# Patient Record
Sex: Female | Born: 1970 | Race: Black or African American | Hispanic: No | Smoking: Never smoker
Health system: Southern US, Community
[De-identification: ages and names within clinical notes are randomized; demographics above are authoritative.]

## PROBLEM LIST (undated history)

## (undated) DIAGNOSIS — R002 Palpitations: Secondary | ICD-10-CM

## (undated) DIAGNOSIS — R079 Chest pain, unspecified: Secondary | ICD-10-CM

## (undated) DIAGNOSIS — I10 Essential (primary) hypertension: Secondary | ICD-10-CM

## (undated) HISTORY — DX: Palpitations: R00.2

## (undated) HISTORY — DX: Chest pain, unspecified: R07.9

## (undated) HISTORY — PX: TUBAL LIGATION: SHX77

## (undated) HISTORY — PX: CHOLECYSTECTOMY: SHX55

---

## 1997-10-02 ENCOUNTER — Other Ambulatory Visit: Admission: RE | Admit: 1997-10-02 | Discharge: 1997-10-02 | Payer: Self-pay | Admitting: Obstetrics & Gynecology

## 1997-10-22 ENCOUNTER — Inpatient Hospital Stay (HOSPITAL_COMMUNITY): Admission: AD | Admit: 1997-10-22 | Discharge: 1997-10-22 | Payer: Self-pay | Admitting: Obstetrics and Gynecology

## 1997-10-26 ENCOUNTER — Inpatient Hospital Stay (HOSPITAL_COMMUNITY): Admission: AD | Admit: 1997-10-26 | Discharge: 1997-10-28 | Payer: Self-pay | Admitting: Obstetrics & Gynecology

## 2001-08-10 ENCOUNTER — Ambulatory Visit (HOSPITAL_COMMUNITY): Admission: RE | Admit: 2001-08-10 | Discharge: 2001-08-10 | Payer: Self-pay | Admitting: Family Medicine

## 2001-08-10 ENCOUNTER — Encounter: Payer: Self-pay | Admitting: Family Medicine

## 2001-09-03 ENCOUNTER — Encounter: Payer: Self-pay | Admitting: Emergency Medicine

## 2001-09-03 ENCOUNTER — Emergency Department (HOSPITAL_COMMUNITY): Admission: EM | Admit: 2001-09-03 | Discharge: 2001-09-03 | Payer: Self-pay | Admitting: Emergency Medicine

## 2001-09-03 ENCOUNTER — Encounter: Payer: Self-pay | Admitting: *Deleted

## 2003-07-11 ENCOUNTER — Other Ambulatory Visit: Admission: RE | Admit: 2003-07-11 | Discharge: 2003-07-11 | Payer: Self-pay | Admitting: Family Medicine

## 2004-08-27 ENCOUNTER — Emergency Department (HOSPITAL_COMMUNITY): Admission: EM | Admit: 2004-08-27 | Discharge: 2004-08-28 | Payer: Self-pay | Admitting: Emergency Medicine

## 2007-05-07 ENCOUNTER — Emergency Department (HOSPITAL_COMMUNITY): Admission: EM | Admit: 2007-05-07 | Discharge: 2007-05-07 | Payer: Self-pay | Admitting: Emergency Medicine

## 2011-01-13 ENCOUNTER — Encounter: Payer: Self-pay | Admitting: *Deleted

## 2011-01-13 ENCOUNTER — Emergency Department (HOSPITAL_BASED_OUTPATIENT_CLINIC_OR_DEPARTMENT_OTHER)
Admission: EM | Admit: 2011-01-13 | Discharge: 2011-01-13 | Disposition: A | Discharge status: Home / Self Care | Payer: BC Managed Care – PPO | Attending: Emergency Medicine | Admitting: Emergency Medicine

## 2011-01-13 DIAGNOSIS — R209 Unspecified disturbances of skin sensation: Secondary | ICD-10-CM | POA: Insufficient documentation

## 2011-01-13 DIAGNOSIS — IMO0002 Reserved for concepts with insufficient information to code with codable children: Secondary | ICD-10-CM | POA: Insufficient documentation

## 2011-01-13 DIAGNOSIS — X58XXXA Exposure to other specified factors, initial encounter: Secondary | ICD-10-CM | POA: Insufficient documentation

## 2011-01-13 DIAGNOSIS — M779 Enthesopathy, unspecified: Secondary | ICD-10-CM

## 2011-01-13 DIAGNOSIS — T148XXA Other injury of unspecified body region, initial encounter: Secondary | ICD-10-CM

## 2011-01-13 DIAGNOSIS — Y9229 Other specified public building as the place of occurrence of the external cause: Secondary | ICD-10-CM | POA: Insufficient documentation

## 2011-01-13 HISTORY — DX: Essential (primary) hypertension: I10

## 2011-01-13 MED ORDER — IBUPROFEN 800 MG PO TABS: 800.0000 mg | ORAL_TABLET | Freq: Three times a day (TID) | ORAL | Status: AC | PRN

## 2011-01-13 MED ORDER — IBUPROFEN 800 MG PO TABS
800.0000 mg | ORAL_TABLET | Freq: Once | ORAL | Status: AC
  Administered 2011-01-13: 800 mg via ORAL
  Filled 2011-01-13: qty 1

## 2011-01-13 MED ORDER — DIAZEPAM 5 MG PO TABS
5.0000 mg | ORAL_TABLET | Freq: Once | ORAL | Status: AC
  Administered 2011-01-13: 5 mg via ORAL
  Filled 2011-01-13: qty 1

## 2011-01-13 MED ORDER — DIAZEPAM 5 MG PO TABS: 5.0000 mg | ORAL_TABLET | Freq: Four times a day (QID) | ORAL | Status: AC | PRN

## 2011-01-13 NOTE — ED Notes (Signed)
PT c/o left upper leg pain that began approx 2days ago while walking at work. No obvious or known injury.

## 2011-01-13 NOTE — ED Provider Notes (Signed)
History    The patient reports 2 days of left leg pain that on closer examination is localized to the left hip flexor tendon origin and the left adductor tendon origin. She reports first noticing the pain while she was at work 2 days ago, she works at [employer] as a Production designer, theatre/television/film. She denies any new exercise routine for recent exertional strain that may have strained the muscles. The pain is worse with active hip flexion on the left or abduction of the hip on the left. The pain is described as a dull ache and is nonradiating and well localized. She denies any distal numbness or weakness of the left lower extremity. She denies any localized rash and there is no evidence of lymphadenopathy.  No chief complaint on file.  Patient is a 40 y.o. female presenting with hip pain. The history is provided by the patient.  Hip Pain This is a new problem. The current episode started 2 days ago. The problem occurs constantly. The problem has not changed since onset.Pertinent negatives include no chest pain and no shortness of breath. The symptoms are aggravated by walking, bending and standing. The symptoms are relieved by nothing. She has tried nothing for the symptoms.    No past medical history on file.  No past surgical history on file.  No family history on file.  History  Substance Use Topics  . Smoking status: Not on file  . Smokeless tobacco: Not on file  . Alcohol Use: Not on file    OB History    No data available      Review of Systems  Respiratory: Negative for shortness of breath.   Cardiovascular: Negative for chest pain.  Musculoskeletal: Negative for back pain, joint swelling and gait problem.  Skin: Negative.   Neurological: Negative for weakness and numbness.    Physical Exam  There were no vitals taken for this visit.  Physical Exam  Constitutional: She is oriented to person, place, and time. She appears well-developed. No distress.  HENT:  Head: Normocephalic and atraumatic.    Eyes: EOM are normal. Pupils are equal, round, and reactive to light.  Neck: Neck supple. No JVD present.  Cardiovascular: Normal rate, regular rhythm, normal heart sounds and intact distal pulses.  Exam reveals no gallop and no friction rub.   No murmur heard. Pulmonary/Chest: Effort normal and breath sounds normal. No respiratory distress. She has no wheezes. She has no rales.  Abdominal: Soft. There is no tenderness. There is no guarding.  Musculoskeletal: Normal range of motion. She exhibits tenderness. She exhibits no edema.       Left hip: She exhibits tenderness.       Legs: Neurological: She is alert and oriented to person, place, and time. She has normal reflexes.  Skin: Skin is warm and dry. No rash noted.  Psychiatric: She has a normal mood and affect.    ED Course  Procedures  MDM Muscle strain and possibly mild tendonitis.  No localized infectious or inflammatory process suggested otherwise.  The patient's presentation is not suggestive of thromboembolism or fracture.      Felisa Bonier, MD 01/13/11 2016

## 2011-03-03 ENCOUNTER — Other Ambulatory Visit: Payer: Self-pay | Admitting: Obstetrics and Gynecology

## 2011-03-03 DIAGNOSIS — R928 Other abnormal and inconclusive findings on diagnostic imaging of breast: Secondary | ICD-10-CM

## 2011-03-31 ENCOUNTER — Ambulatory Visit
Admission: RE | Admit: 2011-03-31 | Discharge: 2011-03-31 | Disposition: A | Discharge status: Home / Self Care | Payer: BC Managed Care – PPO | Source: Ambulatory Visit | Attending: Obstetrics and Gynecology | Admitting: Obstetrics and Gynecology

## 2011-03-31 DIAGNOSIS — R928 Other abnormal and inconclusive findings on diagnostic imaging of breast: Secondary | ICD-10-CM

## 2011-08-31 ENCOUNTER — Emergency Department (HOSPITAL_COMMUNITY)
Admission: EM | Admit: 2011-08-31 | Discharge: 2011-08-31 | Disposition: A | Discharge status: Home / Self Care | Payer: BC Managed Care – PPO | Attending: Emergency Medicine | Admitting: Emergency Medicine

## 2011-08-31 ENCOUNTER — Encounter (HOSPITAL_COMMUNITY): Payer: Self-pay | Admitting: *Deleted

## 2011-08-31 ENCOUNTER — Other Ambulatory Visit: Payer: Self-pay

## 2011-08-31 DIAGNOSIS — Z9851 Tubal ligation status: Secondary | ICD-10-CM | POA: Insufficient documentation

## 2011-08-31 DIAGNOSIS — R071 Chest pain on breathing: Secondary | ICD-10-CM | POA: Insufficient documentation

## 2011-08-31 DIAGNOSIS — I10 Essential (primary) hypertension: Secondary | ICD-10-CM

## 2011-08-31 DIAGNOSIS — R51 Headache: Secondary | ICD-10-CM | POA: Insufficient documentation

## 2011-08-31 DIAGNOSIS — R0789 Other chest pain: Secondary | ICD-10-CM

## 2011-08-31 DIAGNOSIS — Z9889 Other specified postprocedural states: Secondary | ICD-10-CM | POA: Insufficient documentation

## 2011-08-31 LAB — URINALYSIS, ROUTINE W REFLEX MICROSCOPIC
Glucose, UA: NEGATIVE mg/dL
Specific Gravity, Urine: 1.017 (ref 1.005–1.030)
Urobilinogen, UA: 0.2 mg/dL (ref 0.0–1.0)

## 2011-08-31 LAB — URINE MICROSCOPIC-ADD ON

## 2011-08-31 LAB — BASIC METABOLIC PANEL
CO2: 27 mEq/L (ref 19–32)
Calcium: 9.8 mg/dL (ref 8.4–10.5)
Chloride: 103 mEq/L (ref 96–112)
Glucose, Bld: 104 mg/dL — ABNORMAL HIGH (ref 70–99)
Potassium: 4.3 mEq/L (ref 3.5–5.1)
Sodium: 137 mEq/L (ref 135–145)

## 2011-08-31 LAB — CBC
MCV: 79 fL (ref 78.0–100.0)
Platelets: 233 10*3/uL (ref 150–400)
RBC: 4.81 MIL/uL (ref 3.87–5.11)
WBC: 6.6 10*3/uL (ref 4.0–10.5)

## 2011-08-31 LAB — DIFFERENTIAL
Eosinophils Relative: 3 % (ref 0–5)
Lymphocytes Relative: 33 % (ref 12–46)
Lymphs Abs: 2.2 10*3/uL (ref 0.7–4.0)
Neutro Abs: 3.5 10*3/uL (ref 1.7–7.7)

## 2011-08-31 MED ORDER — ACETAMINOPHEN 500 MG PO TABS
1000.0000 mg | ORAL_TABLET | ORAL | Status: AC
  Administered 2011-08-31: 1000 mg via ORAL
  Filled 2011-08-31: qty 2

## 2011-08-31 MED ORDER — HYDROCHLOROTHIAZIDE 25 MG PO TABS: 25.0000 mg | ORAL_TABLET | Freq: Once | ORAL | Status: DC

## 2011-08-31 MED ORDER — HYDROCHLOROTHIAZIDE 25 MG PO TABS
25.0000 mg | ORAL_TABLET | ORAL | Status: AC
  Administered 2011-08-31: 25 mg via ORAL
  Filled 2011-08-31: qty 1

## 2011-08-31 NOTE — Discharge Instructions (Signed)
As discussed, your hypertension is poorly controlled.  I have added hydrochlorothiazide or HCTZ to your medication regime.  25 mg in the morning with the lisinopril if you develop dizziness or feel off balance.  Try cutting the tablet in half and taking 12.5 mg daily.  Please call and make an appointment with Dr. Wynelle Link to be reexamined next week.  FT been taking the medication for approximately one week.  Your chest pain appears to be musculoskeletal in nature as it is reproducible.  Your EKG and labs are normal.  Please take regular doses of Tylenol 650 mg 2-3 times a day for this discomfort

## 2011-08-31 NOTE — ED Provider Notes (Signed)
History     CSN: [id number]  Arrival date & time 08/31/11  1559   First MD Initiated Contact with Patient 08/31/11 1852      Chief Complaint  Patient presents with  . Headache  . Chest Pain    (Consider location/radiation/quality/duration/timing/severity/associated sxs/prior treatment) HPI Comments: Patient has had a right frontal headache for the past 2 days without visual disturbances, earache, history recent URI, sinusitis, today at work he developed upper, mid, chest discomfort, that is reproducible.  She denies shortness of breath, nausea, diaphoresis.  He does have a history of poorly controlled hypertension followed by Dr. Wynelle Link who has instructed patient to keep taking her medication and lose weight  Patient is a 41 y.o. female presenting with headaches and chest pain. The history is provided by the patient.  Headache  The current episode started 2 days ago. The problem occurs constantly. The pain is at a severity of 5/10. The pain is moderate. The pain does not radiate. Associated symptoms include chest pressure. Pertinent negatives include no fever, no near-syncope, no orthopnea and no shortness of breath.  Chest Pain Pertinent negatives for primary symptoms include no fever, no shortness of breath and no dizziness.  Pertinent negatives for associated symptoms include no near-syncope and no orthopnea.     Past Medical History  Diagnosis Date  . Hypertension     Past Surgical History  Procedure Date  . Cholecystectomy   . Tubal ligation     History reviewed. No pertinent family history.  History  Substance Use Topics  . Smoking status: Never Smoker   . Smokeless tobacco: Not on file  . Alcohol Use: Yes     occasional drinker    OB History    Grav Para Term Preterm Abortions TAB SAB Ect Mult Living                  Review of Systems  Constitutional: Negative for fever and chills.  HENT: Negative for ear pain and rhinorrhea.   Eyes: Negative for visual  disturbance.  Respiratory: Negative for shortness of breath.   Cardiovascular: Positive for chest pain. Negative for orthopnea and near-syncope.  Neurological: Positive for headaches. Negative for dizziness.    Allergies  Review of patient's allergies indicates no known allergies.  Home Medications   Current Outpatient Rx  Name Route Sig Dispense Refill  . BENAZEPRIL HCL 20 MG PO TABS Oral Take 20 mg by mouth daily.      Marland Kitchen HYDROCHLOROTHIAZIDE 25 MG PO TABS Oral Take 1 tablet (25 mg total) by mouth once. 30 tablet 0    BP 133/91  Pulse 80  Temp(Src) 98.7 F (37.1 C) (Oral)  Resp 18  SpO2 100%  LMP 08/30/2011  Physical Exam  Constitutional: She is oriented to person, place, and time. She appears well-developed and well-nourished.  HENT:  Head: Normocephalic.  Neck: Normal range of motion.  Cardiovascular: Normal rate.   Pulmonary/Chest: Effort normal. She exhibits tenderness. She exhibits no laceration, no crepitus, no edema, no swelling and no retraction.    Neurological: She is alert and oriented to person, place, and time.  Skin: Skin is warm and dry.    ED Course  Procedures (including critical care time)  Labs Reviewed  URINALYSIS, ROUTINE W REFLEX MICROSCOPIC - Abnormal; Notable for the following:    APPearance CLOUDY (*)    Hgb urine dipstick LARGE (*)    Leukocytes, UA SMALL (*)    All other components within normal limits  CBC - Abnormal; Notable for the following:    Hemoglobin 11.8 (*)    MCH 24.5 (*)    All other components within normal limits  BASIC METABOLIC PANEL - Abnormal; Notable for the following:    Glucose, Bld 104 (*)    GFR calc non Af Amer 90 (*)    All other components within normal limits  DIFFERENTIAL  URINE MICROSCOPIC-ADD ON   No results found.   1. Chest wall pain   2. Hypertension, poor control   3. Headache     ED ECG REPORT   Date: 08/31/2011  EKG Time: 9:52 PM  Rate: 83  Rhythm: normal sinus rhythm,  unchanged from  previous tracings  Axis: normal  Intervals:none  ST&T Change: none  Narrative Interpretation: normal            MDM  This patient has reproducible chest pain, which I feel is muscular in nature.  Her EKG is normal.  Cardiac markers are normal.  Lites are normal.  She is slightly hypertensive with a diastolic of 100, which I feel is the cause for her headache.  I will add an additional hydrochlorothiazide to her current lisinopril and have instructed the patient to followup with her primary care physician next week        Arman Filter, NP 08/31/11 2146  Arman Filter, NP 08/31/11 2152  Arman Filter, NP 08/31/11 2152

## 2011-08-31 NOTE — ED Notes (Signed)
EKG done at 16:14.  Signed by Dr. Juleen China, signed copy in patients chart along with extra copy at this time

## 2011-08-31 NOTE — ED Notes (Signed)
Multiple complaints. Having headache for several days and now having chest tightness this am. No acute distress noted at triage, resp e/u. Will do ekg at triage.

## 2011-08-31 NOTE — ED Notes (Signed)
Reports onset of rgt sided h/a x2 days - constant - states this h/a similar to when she is hypertensive; hx of same - has been taking meds as prescribed; also endorses photophobia; additionally, reports substernal cp - nonrad - onset this am - associated with nausea

## 2011-09-01 NOTE — ED Provider Notes (Signed)
Medical screening examination/treatment/procedure(s) were performed by non-physician practitioner and as supervising physician I was immediately available for consultation/collaboration.  Bethenny Losee, MD 09/01/11 0226 

## 2012-08-05 ENCOUNTER — Emergency Department (HOSPITAL_COMMUNITY)
Admission: EM | Admit: 2012-08-05 | Discharge: 2012-08-05 | Disposition: A | Discharge status: Home / Self Care | Payer: BC Managed Care – PPO | Attending: Emergency Medicine | Admitting: Emergency Medicine

## 2012-08-05 ENCOUNTER — Emergency Department (HOSPITAL_COMMUNITY): Payer: BC Managed Care – PPO

## 2012-08-05 ENCOUNTER — Encounter (HOSPITAL_COMMUNITY): Payer: Self-pay | Admitting: Emergency Medicine

## 2012-08-05 DIAGNOSIS — R0602 Shortness of breath: Secondary | ICD-10-CM | POA: Insufficient documentation

## 2012-08-05 DIAGNOSIS — M940 Chondrocostal junction syndrome [Tietze]: Secondary | ICD-10-CM | POA: Insufficient documentation

## 2012-08-05 DIAGNOSIS — R1032 Left lower quadrant pain: Secondary | ICD-10-CM | POA: Insufficient documentation

## 2012-08-05 DIAGNOSIS — R110 Nausea: Secondary | ICD-10-CM | POA: Insufficient documentation

## 2012-08-05 DIAGNOSIS — R9431 Abnormal electrocardiogram [ECG] [EKG]: Secondary | ICD-10-CM

## 2012-08-05 DIAGNOSIS — I10 Essential (primary) hypertension: Secondary | ICD-10-CM | POA: Insufficient documentation

## 2012-08-05 LAB — CBC WITH DIFFERENTIAL/PLATELET
Basophils Absolute: 0 10*3/uL (ref 0.0–0.1)
Basophils Relative: 0 % (ref 0–1)
Lymphocytes Relative: 50 % — ABNORMAL HIGH (ref 12–46)
MCHC: 30.8 g/dL (ref 30.0–36.0)
Neutro Abs: 2 10*3/uL (ref 1.7–7.7)
Neutrophils Relative %: 36 % — ABNORMAL LOW (ref 43–77)
Platelets: 264 10*3/uL (ref 150–400)
RDW: 14.7 % (ref 11.5–15.5)
WBC: 5.4 10*3/uL (ref 4.0–10.5)

## 2012-08-05 LAB — POCT I-STAT TROPONIN I: Troponin i, poc: 0 ng/mL (ref 0.00–0.08)

## 2012-08-05 LAB — BASIC METABOLIC PANEL
CO2: 25 mEq/L (ref 19–32)
Calcium: 9.7 mg/dL (ref 8.4–10.5)
Chloride: 101 mEq/L (ref 96–112)
Creatinine, Ser: 0.83 mg/dL (ref 0.50–1.10)
GFR calc Af Amer: >90 mL/min (ref >90)
Sodium: 139 mEq/L (ref 135–145)

## 2012-08-05 MED ORDER — METHOCARBAMOL 500 MG PO TABS
500.0000 mg | ORAL_TABLET | Freq: Once | ORAL | Status: AC
  Administered 2012-08-05: 500 mg via ORAL
  Filled 2012-08-05: qty 1

## 2012-08-05 MED ORDER — BENAZEPRIL HCL 20 MG PO TABS: 20.0000 mg | ORAL_TABLET | Freq: Every day | ORAL | Status: DC

## 2012-08-05 MED ORDER — IBUPROFEN 600 MG PO TABS: 600.0000 mg | ORAL_TABLET | Freq: Four times a day (QID) | ORAL | Status: AC | PRN

## 2012-08-05 MED ORDER — KETOROLAC TROMETHAMINE 30 MG/ML IJ SOLN
30.0000 mg | Freq: Once | INTRAMUSCULAR | Status: AC
  Administered 2012-08-05: 30 mg via INTRAVENOUS
  Filled 2012-08-05: qty 1

## 2012-08-05 MED ORDER — HYDROCHLOROTHIAZIDE 25 MG PO TABS: 25.0000 mg | ORAL_TABLET | Freq: Once | ORAL | Status: DC

## 2012-08-05 MED ORDER — METHOCARBAMOL 500 MG PO TABS: 500.0000 mg | ORAL_TABLET | Freq: Two times a day (BID) | ORAL | Status: DC

## 2012-08-05 NOTE — ED Notes (Signed)
Patient transported to X-ray 

## 2012-08-05 NOTE — ED Notes (Signed)
Onset one day ago chest pain 8/10 achy sharp with shortness of breath continued today. States onset 2 weeks ago LUQ and LLQ abdominal pain "feel like a knot" with nausea.

## 2012-08-05 NOTE — ED Provider Notes (Addendum)
History  This chart was scribed for Paula Racer, MD by Shari Heritage, ED Scribe. The patient was seen in room CD03C/CD03C. Patient's care was started at 1215.   CSN: [id number]  Arrival date & time 08/05/12  1202   First MD Initiated Contact with Patient 08/05/12 1215      Chief Complaint  Patient presents with  . Chest Pain  . Shortness of Breath     Patient is a 42 y.o. female presenting with chest pain. The history is provided by the patient. No language interpreter was used.  Chest Pain The chest pain began yesterday. Chest pain occurs constantly. The chest pain is unchanged. The pain is associated with breathing. The severity of the pain is moderate. The quality of the pain is described as aching. The pain does not radiate. Primary symptoms include shortness of breath and nausea. Pertinent negatives for primary symptoms include no fever, no cough, no abdominal pain and no vomiting.  The shortness of breath began yesterday. The patient's medical history does not include CHF, COPD, asthma or chronic lung disease. She tried nothing for the symptoms.  Her past medical history is significant for hypertension.  Pertinent negatives for family medical history include: no early MI in family and no PE in family.     HPI Comments: Paula Nelson is a 42 y.o. female with history of HTN who presents to the Emergency Department complaining of gradual onset, moderate, aching, localized, central chest pain with associated shortness of breath onset yesterday. Pain is unchanged with deep breaths and movement. Patient reports associated nausea. Patient denies cough, fever, chills, vomiting, new leg swelling or leg pain. She states that she has had similar chest pain before which was attributed to elevated blood pressure. She hasn't had this pain for several months. Patient denies any recent car trips. No new exertional activities or exercises. She has had no recent surgeries. Patient denies personal or  family history of DVT, PE or MI. She states that her father has atrial fibrillation.   Past Medical History  Diagnosis Date  . Hypertension     Past Surgical History  Procedure Date  . Cholecystectomy   . Tubal ligation     No family history on file.  History  Substance Use Topics  . Smoking status: Never Smoker   . Smokeless tobacco: Not on file  . Alcohol Use: No     Comment: occasional drinker    OB History    Grav Para Term Preterm Abortions TAB SAB Ect Mult Living                  Review of Systems  Constitutional: Negative for fever and chills.  Respiratory: Positive for shortness of breath. Negative for cough.   Cardiovascular: Positive for chest pain. Negative for leg swelling.  Gastrointestinal: Positive for nausea. Negative for vomiting and abdominal pain.  All other systems reviewed and are negative.    Allergies  Review of patient's allergies indicates no known allergies.  Home Medications   Current Outpatient Rx  Name  Route  Sig  Dispense  Refill  . BENAZEPRIL HCL 20 MG PO TABS   Oral   Take 1 tablet (20 mg total) by mouth daily.   30 tablet   0   . HYDROCHLOROTHIAZIDE 25 MG PO TABS   Oral   Take 1 tablet (25 mg total) by mouth once.   30 tablet   0   . IBUPROFEN 600 MG PO TABS   Oral  Take 1 tablet (600 mg total) by mouth every 6 (six) hours as needed for pain.   30 tablet   0   . METHOCARBAMOL 500 MG PO TABS   Oral   Take 1 tablet (500 mg total) by mouth 2 (two) times daily.   20 tablet   0     Triage Vitals: BP 174/99  Pulse 96  Temp 98.2 F (36.8 C) (Oral)  Resp 18  SpO2 100%  Physical Exam  Constitutional: She is oriented to person, place, and time. She appears well-developed and well-nourished. No distress.  HENT:  Head: Normocephalic and atraumatic.  Mouth/Throat: Oropharynx is clear and moist.  Eyes: Conjunctivae normal and EOM are normal. Pupils are equal, round, and reactive to light.  Neck: Normal range of  motion. Neck supple.  Cardiovascular: Normal rate, regular rhythm and normal heart sounds.  Exam reveals no gallop and no friction rub.   No murmur heard. Pulmonary/Chest: Effort normal and breath sounds normal. No respiratory distress. She has no wheezes. She has no rales. She exhibits tenderness.       Tenderness to palpation of right costochondral area.   Abdominal: Soft. Bowel sounds are normal. She exhibits no distension. There is no tenderness. There is no rebound and no guarding.  Musculoskeletal: Normal range of motion. She exhibits no edema and no tenderness.       Calves are nontender. No leg swelling.  Neurological: She is alert and oriented to person, place, and time.  Skin: Skin is warm and dry.  Psychiatric: She has a normal mood and affect. Her behavior is normal.    ED Course  Procedures (including critical care time) DIAGNOSTIC STUDIES: Oxygen Saturation is 100% on room air, normal by my interpretation.    COORDINATION OF CARE: 12:43 PM- Patient informed of current plan for treatment and evaluation and agrees with plan at this time.   2:50 PM- Blood work and chest x-ray not concerning for acute cardiac disease. Will give follow up instructions with cardiology for abnormal EKG. Patient verbalizes understanding and agrees with plan.    Labs Reviewed  CBC WITH DIFFERENTIAL - Abnormal; Notable for the following:    Hemoglobin 10.9 (*)     HCT 35.4 (*)     MCV 75.6 (*)     MCH 23.3 (*)     Neutrophils Relative 36 (*)     Lymphocytes Relative 50 (*)     All other components within normal limits  BASIC METABOLIC PANEL - Abnormal; Notable for the following:    Potassium 3.1 (*)     Glucose, Bld 102 (*)     GFR calc non Af Amer 88 (*)     All other components within normal limits  POCT I-STAT TROPONIN I   Dg Chest 2 View  08/05/2012  *RADIOLOGY REPORT*  Clinical Data: Chest pain, shortness of breath.  CHEST - 2 VIEW  Comparison: 08/27/2004  Findings: Vascular clips in  the right upper abdomen. Lungs clear. Heart size and pulmonary vascularity normal.  No effusion. Visualized bones unremarkable.  IMPRESSION: No acute disease   Original Report Authenticated By: D. Andria Rhein, MD     1. Costochondritis   2. Nonspecific abnormal electrocardiogram (ECG) (EKG)      Date: 08/05/2012  Rate: 94  Rhythm: normal sinus rhythm  QRS Axis: normal  Intervals: normal  ST/T Wave abnormalities: nonspecific T wave changes  Conduction Disutrbances:none  Narrative Interpretation:   Old EKG Reviewed: changes noted Question new t-wave  flattening compared to old EKG   MDM   I personally performed the services described in this documentation, which was scribed in my presence. The recorded information has been reviewed and is accurate.     Paula Racer, MD 08/05/12 1700  Paula Racer, MD 08/05/12 1700

## 2012-08-13 ENCOUNTER — Ambulatory Visit (INDEPENDENT_AMBULATORY_CARE_PROVIDER_SITE_OTHER): Payer: BC Managed Care – PPO | Admitting: Cardiology

## 2012-08-13 ENCOUNTER — Encounter: Payer: Self-pay | Admitting: Cardiology

## 2012-08-13 VITALS — BP 150/100 | HR 92 | Ht 59.0 in | Wt 174.8 lb

## 2012-08-13 DIAGNOSIS — R072 Precordial pain: Secondary | ICD-10-CM

## 2012-08-13 DIAGNOSIS — R002 Palpitations: Secondary | ICD-10-CM

## 2012-08-13 DIAGNOSIS — I10 Essential (primary) hypertension: Secondary | ICD-10-CM

## 2012-08-13 MED ORDER — BENAZEPRIL HCL 20 MG PO TABS: 20.0000 mg | ORAL_TABLET | Freq: Two times a day (BID) | ORAL | Status: DC

## 2012-08-13 NOTE — Patient Instructions (Addendum)
Please increase your Benazepril to twice a day Continue all other medications as listed  Your physician has recommended that you wear a holter monitor. Holter monitors are medical devices that record the heart's electrical activity. Doctors most often use these monitors to diagnose arrhythmias. Arrhythmias are problems with the speed or rhythm of the heartbeat. The monitor is a small, portable device. You can wear one while you do your normal daily activities. This is usually used to diagnose what is causing palpitations/syncope (passing out).  Follow up with Dr Antoine Poche in 6 weeks.

## 2012-08-13 NOTE — Progress Notes (Signed)
HPI The patient presents for evaluation of palpitations, chest discomfort and hypertension. She was in the emergency room recently with chest discomfort but she was felt to have costochondritis. She denies any ongoing chest discomfort similar to that presentation. It was sharp and burning. However, she does always feel a kind of squeezing or heart thumping. This is in her left chest. It's been a constant phenomenon recently. It's been progressing over weeks. She gets occasional shortness of breath. She says her heart beats fast but not irregularly. She doesn't exercise routinely but does some light housekeeping. With this she does not necessarily make the symptoms worse. She does not describe PND or orthopnea. She's not had any presyncope or syncope.  No Known Allergies  Current Outpatient Prescriptions  Medication Sig Dispense Refill  . benazepril (LOTENSIN) 20 MG tablet Take 1 tablet (20 mg total) by mouth 2 (two) times daily.  60 tablet  6  . hydrochlorothiazide (HYDRODIURIL) 25 MG tablet Take 1 tablet (25 mg total) by mouth once.  30 tablet  0  . ibuprofen (ADVIL,MOTRIN) 600 MG tablet Take 1 tablet (600 mg total) by mouth every 6 (six) hours as needed for pain.  30 tablet  0  . methocarbamol (ROBAXIN) 500 MG tablet Take 1 tablet (500 mg total) by mouth 2 (two) times daily.  20 tablet  0   No current facility-administered medications for this visit.    Past Medical History  Diagnosis Date  . Hypertension     Past Surgical History  Procedure Laterality Date  . Cholecystectomy    . Tubal ligation      ROS:    Headaches, dizziness, dyspnea, leg cramping. Otherwise as stated in the history of present illness and negative for all other systems.  PHYSICAL EXAM BP 150/100  Pulse 92  Ht 4\' 11"  (1.499 m)  Wt 174 lb 12.8 oz (79.289 kg)  BMI 35.29 kg/m2  LMP 07/26/2012 GENERAL:  Well appearing HEENT:  Pupils equal round and reactive, fundi not visualized, oral mucosa  unremarkable NECK:  No jugular venous distention, waveform within normal limits, carotid upstroke brisk and symmetric, no bruits, no thyromegaly LYMPHATICS:  No cervical, inguinal adenopathy LUNGS:  Clear to auscultation bilaterally BACK:  No CVA tenderness CHEST:  Unremarkable HEART:  PMI not displaced or sustained,S1 and S2 within normal limits, no S3, no S4, no clicks, no rubs, no murmurs ABD:  Flat, positive bowel sounds normal in frequency in pitch, no bruits, no rebound, no guarding, no midline pulsatile mass, no hepatomegaly, no splenomegaly EXT:  2 plus pulses throughout, no edema, no cyanosis no clubbing SKIN:  No rashes no nodules NEURO:  Cranial nerves II through XII grossly intact, motor grossly intact throughout PSYCH:  Cognitively intact, oriented to person place and time  EKG:  Normal sinus rhythm, rate 94, axis within normal limits, intervals within normal limits, nonspecific diffuse T wave changes slightly more pronounced than previous. 31-Aug-2012   ASSESSMENT AND PLAN  Chest pain: This is atypical and was felt to be musculoskeletal. No further workup is planned at this point.  Hypertension: Her blood pressure is not at target. I will increase the lisinopril to 20 mg daily.  Overweight: The patient understands the need to lose weight with diet and exercise. We have discussed specific strategies for this.  Palpitations: I will plan a 24-hour Holter. Further evaluation will be based on these results.  Abnormal EKG: This may be related to hypertension. I will consider echocardiography and treadmill testing in  the future as her blood pressure is better controlled.

## 2012-08-21 ENCOUNTER — Encounter (INDEPENDENT_AMBULATORY_CARE_PROVIDER_SITE_OTHER): Payer: BC Managed Care – PPO

## 2012-08-21 ENCOUNTER — Telehealth: Payer: Self-pay | Admitting: *Deleted

## 2012-08-21 DIAGNOSIS — R002 Palpitations: Secondary | ICD-10-CM

## 2012-08-21 NOTE — Telephone Encounter (Signed)
24 hr holter monitor placed on PT 08/21/12 TK

## 2012-09-25 ENCOUNTER — Telehealth: Payer: Self-pay

## 2012-09-25 NOTE — Telephone Encounter (Signed)
lmtco about monitor results

## 2012-10-04 ENCOUNTER — Encounter: Payer: Self-pay | Admitting: Cardiology

## 2012-10-04 ENCOUNTER — Ambulatory Visit (INDEPENDENT_AMBULATORY_CARE_PROVIDER_SITE_OTHER): Payer: BC Managed Care – PPO | Admitting: Cardiology

## 2012-10-04 VITALS — BP 140/90 | HR 88 | Ht 59.0 in | Wt 168.0 lb

## 2012-10-04 DIAGNOSIS — I10 Essential (primary) hypertension: Secondary | ICD-10-CM

## 2012-10-04 DIAGNOSIS — R072 Precordial pain: Secondary | ICD-10-CM

## 2012-10-04 DIAGNOSIS — R002 Palpitations: Secondary | ICD-10-CM

## 2012-10-04 NOTE — Progress Notes (Signed)
   HPI The patient presents for evaluation of palpitations, chest discomfort and hypertension. At the last visit I ordered a Holter.  This demonstrated sinus rhythm without any ectopy. She has since had no further palpitations. She's having no new chest discomfort, neck or arm discomfort. She's had no weight gain or edema. She's had no new shortness of breath, PND or orthopnea. She actually ran out of her HCTZ. I have doubled her dose of lisinopril and this seems to be controlling her blood pressure with systolics less than 140.   No Known Allergies  Current Outpatient Prescriptions  Medication Sig Dispense Refill  . benazepril (LOTENSIN) 20 MG tablet Take 1 tablet (20 mg total) by mouth 2 (two) times daily.  60 tablet  6  . hydrochlorothiazide (HYDRODIURIL) 25 MG tablet Take 1 tablet (25 mg total) by mouth once.  30 tablet  0  . ibuprofen (ADVIL,MOTRIN) 600 MG tablet Take 1 tablet (600 mg total) by mouth every 6 (six) hours as needed for pain.  30 tablet  0  . methocarbamol (ROBAXIN) 500 MG tablet Take 1 tablet (500 mg total) by mouth 2 (two) times daily.  20 tablet  0   No current facility-administered medications for this visit.    Past Medical History  Diagnosis Date  . Hypertension     Past Surgical History  Procedure Laterality Date  . Cholecystectomy    . Tubal ligation      ROS:    Headaches, dizziness, dyspnea, leg cramping. Otherwise as stated in the history of present illness and negative for all other systems.  PHYSICAL EXAM BP 140/90  Pulse 88  Ht 4\' 11"  (1.499 m)  Wt 168 lb (76.204 kg)  BMI 33.91 kg/m2  SpO2 98% GENERAL:  Well appearing HEENT:  Pupils equal round and reactive, fundi not visualized, oral mucosa unremarkable NECK:  No jugular venous distention, waveform within normal limits, carotid upstroke brisk and symmetric, no bruits, no thyromegaly LYMPHATICS:  No cervical, inguinal adenopathy LUNGS:  Clear to auscultation bilaterally BACK:  No CVA  tenderness CHEST:  Unremarkable HEART:  PMI not displaced or sustained,S1 and S2 within normal limits, no S3, no S4, no clicks, no rubs, no murmurs ABD:  Flat, positive bowel sounds normal in frequency in pitch, no bruits, no rebound, no guarding, no midline pulsatile mass, no hepatomegaly, no splenomegaly EXT:  2 plus pulses throughout, no edema, no cyanosis no clubbing SKIN:  No rashes no nodules NEURO:  Cranial nerves II through XII grossly intact, motor grossly intact throughout PSYCH:  Cognitively intact, oriented to person place and time   ASSESSMENT AND PLAN  Chest pain: This is atypical and was felt to be musculoskeletal.   Hypertension: She is actually not taking HCTZ and I will remove that from the list. Her meds are controlled on current dose of lisinopril. She will continue this and we also talked about weight loss.Marland Kitchen  Overweight: We had a long discussion about exercise and healthy lifestyles today.  Palpitations: She actually had 0% ectopy on Holter. She's not having any symptoms. No further evaluation is warranted.  Abnormal EKG: I will bring the patient back for a POET (Plain Old Exercise Test). This will allow me to screen for obstructive coronary disease, risk stratify and very importantly provide a prescription for exercise.  The pretest probability of obstructive coronary disease is low.

## 2012-10-04 NOTE — Patient Instructions (Addendum)
Please stop your Hydrochlorothiazide. Continue all other medications as listed.  Your physician has requested that you have an exercise tolerance test. For further information please visit [email or web address] Please also follow instruction sheet, as given.  Follow up will be based on these results.

## 2012-10-29 ENCOUNTER — Ambulatory Visit (INDEPENDENT_AMBULATORY_CARE_PROVIDER_SITE_OTHER): Payer: BC Managed Care – PPO | Admitting: Nurse Practitioner

## 2012-10-29 ENCOUNTER — Encounter: Payer: Self-pay | Admitting: Nurse Practitioner

## 2012-10-29 DIAGNOSIS — R072 Precordial pain: Secondary | ICD-10-CM

## 2012-10-29 DIAGNOSIS — R002 Palpitations: Secondary | ICD-10-CM

## 2012-10-29 NOTE — Procedures (Signed)
Exercise Treadmill Test  Pre-Exercise Testing Evaluation Rhythm: normal sinus  Rate: 96     Test  Exercise Tolerance Test Ordering MD: Angelina Sheriff, MD  Interpreting MD: Norma Fredrickson NP  Unique Test No:1 Treadmill:  1  Indication for ETT: chest pain - rule out ischemia  Contraindication to ETT: No   Stress Modality: exercise - treadmill  Cardiac Imaging Performed: non   Protocol: standard Bruce - maximal  Max BP:  213/100  Max MPHR (bpm):  181 85% MPR (bpm):  154  MPHR obtained (bpm):  162 % MPHR obtained:  90%  Reached 85% MPHR (min:sec):  3:06 Total Exercise Time (min-sec):  6:00  Workload in METS:  7 Borg Scale: 17  Reason ETT Terminated:  patient's desire to stop    ST Segment Analysis At Rest: normal ST segments - no evidence of significant ST depression With Exercise: no evidence of significant ST depression  Other Information Arrhythmia:  No Angina during ETT:  absent (0) Quality of ETT:  diagnostic  ETT Interpretation:  normal - no evidence of ischemia by ST analysis  Comments: Patient presents today for routine GXT. Has had some atypical chest pain. Has HTN and is obese. No regular exercise program.   Today, she exercised on the standard Bruce protocol for 6 minutes. Reduced exercise tolerance. Mildly hypertensive BP response. Clinically negative for chest pain. Test was stopped due to fatigue. EKG negative for ischemia. Diffuse Q waves noted on resting EKG. This was reviewed with Dr. Antoine Poche - not felt to be diagnostic. Her test is felt to be negative for ischemia.   Recommendations: CV risk factor modification with weight loss/regular exercise/adequate BP control.  See back prn.  Patient is agreeable to this plan and will call if any problems develop in the interim.   Rosalio Macadamia, RN, ANP-C [facility] [address] [place], [place]  [address]

## 2013-05-17 ENCOUNTER — Emergency Department (HOSPITAL_BASED_OUTPATIENT_CLINIC_OR_DEPARTMENT_OTHER): Payer: BC Managed Care – PPO

## 2013-05-17 ENCOUNTER — Emergency Department (HOSPITAL_BASED_OUTPATIENT_CLINIC_OR_DEPARTMENT_OTHER)
Admission: EM | Admit: 2013-05-17 | Discharge: 2013-05-17 | Disposition: A | Discharge status: Home / Self Care | Payer: BC Managed Care – PPO | Attending: Emergency Medicine | Admitting: Emergency Medicine

## 2013-05-17 ENCOUNTER — Encounter (HOSPITAL_BASED_OUTPATIENT_CLINIC_OR_DEPARTMENT_OTHER): Payer: Self-pay | Admitting: Emergency Medicine

## 2013-05-17 DIAGNOSIS — Z3202 Encounter for pregnancy test, result negative: Secondary | ICD-10-CM | POA: Insufficient documentation

## 2013-05-17 DIAGNOSIS — I10 Essential (primary) hypertension: Secondary | ICD-10-CM | POA: Insufficient documentation

## 2013-05-17 DIAGNOSIS — B9689 Other specified bacterial agents as the cause of diseases classified elsewhere: Secondary | ICD-10-CM | POA: Insufficient documentation

## 2013-05-17 DIAGNOSIS — A499 Bacterial infection, unspecified: Secondary | ICD-10-CM | POA: Insufficient documentation

## 2013-05-17 DIAGNOSIS — S8990XA Unspecified injury of unspecified lower leg, initial encounter: Secondary | ICD-10-CM | POA: Insufficient documentation

## 2013-05-17 DIAGNOSIS — Z79899 Other long term (current) drug therapy: Secondary | ICD-10-CM | POA: Insufficient documentation

## 2013-05-17 DIAGNOSIS — N760 Acute vaginitis: Secondary | ICD-10-CM | POA: Insufficient documentation

## 2013-05-17 DIAGNOSIS — Y9389 Activity, other specified: Secondary | ICD-10-CM | POA: Insufficient documentation

## 2013-05-17 DIAGNOSIS — Y929 Unspecified place or not applicable: Secondary | ICD-10-CM | POA: Insufficient documentation

## 2013-05-17 DIAGNOSIS — X500XXA Overexertion from strenuous movement or load, initial encounter: Secondary | ICD-10-CM | POA: Insufficient documentation

## 2013-05-17 LAB — URINALYSIS, DIPSTICK ONLY
Glucose, UA: NEGATIVE mg/dL
Leukocytes, UA: NEGATIVE
Nitrite: NEGATIVE
Protein, ur: NEGATIVE mg/dL

## 2013-05-17 LAB — PREGNANCY, URINE: Preg Test, Ur: NEGATIVE

## 2013-05-17 LAB — WET PREP, GENITAL

## 2013-05-17 MED ORDER — KETOROLAC TROMETHAMINE 60 MG/2ML IM SOLN
60.0000 mg | Freq: Once | INTRAMUSCULAR | Status: AC
  Administered 2013-05-17: 60 mg via INTRAMUSCULAR
  Filled 2013-05-17: qty 2

## 2013-05-17 MED ORDER — METRONIDAZOLE 500 MG PO TABS: 500.0000 mg | ORAL_TABLET | Freq: Two times a day (BID) | ORAL | Status: DC

## 2013-05-17 NOTE — ED Notes (Signed)
MD at bedside. 

## 2013-05-17 NOTE — ED Notes (Signed)
C/o low back pain x 1 week radiating around to both sides of low abdomen; c/o right ankle pain x 32mo since injury

## 2013-05-17 NOTE — ED Provider Notes (Signed)
I saw and evaluated the patient, reviewed the resident's note and I agree with the findings and plan. Patient is a 42 year old female presents to the emergency department with a complaint of lower back pain that radiated around to the front. She is also complaining of vaginal discharge. She has had a tubal ligation in the past and denies the possibility of pregnancy. She has no urinary complaints. She denies any fevers or chills.  On exam, she is afebrile vitals are stable. Heart is regular rate and rhythm and lungs are clear. Abdomen is soft. There is mild tenderness to palpation in the suprapubic region with no rebound or guarding. Bowel sounds are normal active. There is no CVA tenderness. Pelvic examination was performed as per Dr. Ermalinda Memos (please see his note).  Workup reveals normal urinalysis and negative pregnancy test. Pelvic exam reveals few clue cells, however her ultrasound fails to reveal any emergent pathology. Her abdomen remains benign. Will treat with Flagyl and when necessary followup. I've considered but doubt appendicitis as there is no right lower quadrant tenderness to palpation.     Geoffery Lyons, MD 05/17/13 1009

## 2013-05-17 NOTE — ED Provider Notes (Signed)
CSN: [id number]     Arrival date & time 05/17/13  0706 History   First MD Initiated Contact with Patient 05/17/13 [phone]     Chief Complaint  Patient presents with  . Back Pain  . Ankle Pain   (Consider location/radiation/quality/duration/timing/severity/associated sxs/prior Treatment) Patient is a 42 y.o. female presenting with back pain and ankle pain.  Back Pain Associated symptoms: dysuria   Associated symptoms: no abdominal pain, no chest pain, no fever and no headaches   Ankle Pain Associated symptoms: back pain   Associated symptoms: no fever     42 year old female here with one week of low back pain radiating bilaterally to her groin and pressure and she urinates. She also notes ankle pain for one month after a fall.  Back pain And states her back pain came on gradually about one week ago, described as sharp and burning 10 out of 10 pain radiating bilaterally to her going. She also notes increased clear to white vaginal discharge, but denies vaginal itching or pain. She denies bowel or bladder dysfunction, saddle anesthesia, weakness or numbness in her LE. She states the back pain is associated with and worsened by urination which is accompanied by pressure-like sensation, it is worsened with walking. She denies any alleviating factors.  Ankle pain Pain started after she "rolled her ankle and "after stepping out of her car into the grass about one month ago. She denies any loss of consciousness or head injury at bedtime, and can bear weight right away. He notes intermittent swelling since that time. Denies pain at any other place on her leg.  Past Medical History  Diagnosis Date  . Hypertension   . Palpitations    Past Surgical History  Procedure Laterality Date  . Cholecystectomy    . Tubal ligation     Family History  Problem Relation Age of Onset  . Atrial fibrillation Father   . Hypertension Father   . Hypertension Mother    History  Substance Use Topics  .  Smoking status: Never Smoker   . Smokeless tobacco: Not on file  . Alcohol Use: No     Comment: occasional drinker   OB History   Grav Para Term Preterm Abortions TAB SAB Ect Mult Living                 Review of Systems  Constitutional: Negative for fever, chills and appetite change.  HENT: Negative for congestion and sore throat.   Respiratory: Negative for cough and shortness of breath.   Cardiovascular: Negative for chest pain.  Gastrointestinal: Negative for abdominal pain.  Genitourinary: Positive for dysuria and vaginal discharge.  Musculoskeletal: Positive for arthralgias, back pain and joint swelling.  Neurological: Negative for headaches.  All other systems reviewed and are negative.    Allergies  Review of patient's allergies indicates no known allergies.  Home Medications   Current Outpatient Rx  Name  Route  Sig  Dispense  Refill  . benazepril (LOTENSIN) 20 MG tablet   Oral   Take 1 tablet (20 mg total) by mouth 2 (two) times daily.   60 tablet   6   . ibuprofen (ADVIL,MOTRIN) 600 MG tablet   Oral   Take 1 tablet (600 mg total) by mouth every 6 (six) hours as needed for pain.   30 tablet   0   . methocarbamol (ROBAXIN) 500 MG tablet   Oral   Take 1 tablet (500 mg total) by mouth 2 (two) times daily.  20 tablet   0    BP 153/96  Pulse 78  Temp(Src) 98.3 F (36.8 C) (Oral)  Resp 16  SpO2 100%  LMP 04/22/2013 Physical Exam  Nursing note and vitals reviewed. Constitutional: She is oriented to person, place, and time. She appears well-developed and well-nourished. No distress.  HENT:  Head: Normocephalic and atraumatic.  Eyes: EOM are normal. Pupils are equal, round, and reactive to light.  Neck: Neck supple.  Cardiovascular: Normal rate, regular rhythm and normal heart sounds.   No murmur heard. Pulmonary/Chest: Effort normal and breath sounds normal.  Abdominal: Soft. Bowel sounds are normal. There is no tenderness. There is no guarding.   Genitourinary: Cervix exhibits no discharge and no friability. Right adnexum displays no mass, no tenderness and no fullness. Left adnexum displays no mass, no tenderness and no fullness. No signs of injury around the vagina. No vaginal discharge found.  Mild cervical tenderness   Musculoskeletal: She exhibits no edema.       Right ankle: Tenderness. AITFL tenderness found. No lateral malleolus, no medial malleolus, no CF ligament, no posterior TFL, no head of 5th metatarsal and no proximal fibula tenderness found.  Neurological: She is alert and oriented to person, place, and time.  Skin: Skin is warm and dry. She is not diaphoretic.  Psychiatric: She has a normal mood and affect.    ED Course  Procedures (including critical care time) Labs Review Labs Reviewed - No data to display Imaging Review No results found.  EKG Interpretation   None       MDM  No diagnosis found.  42 year old female here with low back pain and ankle pain. Low back pain concerning for UTI, no red flags identified. Right ankle without erythema only mild tenderness on exam, and she has been walking on it for about a month. Do not feel that x-rays are required to evaluate her ankle, and we'll treat it as a mild sprain.  Initial labs: UA, UPreg, Wet prep, GCC  Labs only significant for few clue cells, we'll treat BV with Flagyl by mouth. GCC sent  Pelvic ultrasound indicates small 1 cm fibroid but no acute findings.  Safe forDC home, return for worsening symptoms. Followup with PCP or a OB/GYN PRN.   Murtis Sink, MD [facility] Resident, PGY-2 05/17/2013, 9:44 AM     Elenora Gamma, MD 05/17/13 [phone]

## 2013-05-20 LAB — GC/CHLAMYDIA PROBE AMP: GC Probe RNA: NEGATIVE

## 2013-05-22 NOTE — ED Notes (Signed)
+  Chlamydia Chart sent to EDP office for review.  

## 2013-05-31 ENCOUNTER — Telehealth (HOSPITAL_COMMUNITY): Payer: Self-pay

## 2013-05-31 NOTE — ED Notes (Signed)
Chart reviewed by L. Sanders PA "Azithromycin 1,000 mg po x 1 dose"    LVM for pt to return call.

## 2014-01-23 ENCOUNTER — Emergency Department (HOSPITAL_BASED_OUTPATIENT_CLINIC_OR_DEPARTMENT_OTHER)
Admission: EM | Admit: 2014-01-23 | Discharge: 2014-01-23 | Disposition: A | Discharge status: Home / Self Care | Payer: BC Managed Care – PPO | Attending: Emergency Medicine | Admitting: Emergency Medicine

## 2014-01-23 ENCOUNTER — Emergency Department (HOSPITAL_BASED_OUTPATIENT_CLINIC_OR_DEPARTMENT_OTHER): Payer: BC Managed Care – PPO

## 2014-01-23 ENCOUNTER — Encounter (HOSPITAL_BASED_OUTPATIENT_CLINIC_OR_DEPARTMENT_OTHER): Payer: Self-pay | Admitting: Emergency Medicine

## 2014-01-23 DIAGNOSIS — R05 Cough: Secondary | ICD-10-CM | POA: Insufficient documentation

## 2014-01-23 DIAGNOSIS — M79601 Pain in right arm: Secondary | ICD-10-CM

## 2014-01-23 DIAGNOSIS — Z79899 Other long term (current) drug therapy: Secondary | ICD-10-CM | POA: Insufficient documentation

## 2014-01-23 DIAGNOSIS — R0789 Other chest pain: Secondary | ICD-10-CM | POA: Insufficient documentation

## 2014-01-23 DIAGNOSIS — R059 Cough, unspecified: Secondary | ICD-10-CM | POA: Insufficient documentation

## 2014-01-23 DIAGNOSIS — R209 Unspecified disturbances of skin sensation: Secondary | ICD-10-CM | POA: Insufficient documentation

## 2014-01-23 DIAGNOSIS — Z792 Long term (current) use of antibiotics: Secondary | ICD-10-CM | POA: Insufficient documentation

## 2014-01-23 DIAGNOSIS — I10 Essential (primary) hypertension: Secondary | ICD-10-CM | POA: Insufficient documentation

## 2014-01-23 DIAGNOSIS — Z791 Long term (current) use of non-steroidal anti-inflammatories (NSAID): Secondary | ICD-10-CM | POA: Insufficient documentation

## 2014-01-23 DIAGNOSIS — R110 Nausea: Secondary | ICD-10-CM | POA: Insufficient documentation

## 2014-01-23 DIAGNOSIS — M79609 Pain in unspecified limb: Secondary | ICD-10-CM | POA: Insufficient documentation

## 2014-01-23 DIAGNOSIS — R0602 Shortness of breath: Secondary | ICD-10-CM | POA: Insufficient documentation

## 2014-01-23 LAB — CBC WITH DIFFERENTIAL/PLATELET
Basophils Absolute: 0 10*3/uL (ref 0.0–0.1)
Basophils Relative: 0 % (ref 0–1)
EOS PCT: 2 % (ref 0–5)
Eosinophils Absolute: 0.2 10*3/uL (ref 0.0–0.7)
HCT: 31.6 % — ABNORMAL LOW (ref 36.0–46.0)
Hemoglobin: 9.3 g/dL — ABNORMAL LOW (ref 12.0–15.0)
Lymphocytes Relative: 31 % (ref 12–46)
Lymphs Abs: 2.8 10*3/uL (ref 0.7–4.0)
MCH: 20.3 pg — ABNORMAL LOW (ref 26.0–34.0)
MCHC: 29.4 g/dL — ABNORMAL LOW (ref 30.0–36.0)
MCV: 68.8 fL — ABNORMAL LOW (ref 78.0–100.0)
MONOS PCT: 11 % (ref 3–12)
Monocytes Absolute: 1 10*3/uL (ref 0.1–1.0)
Neutro Abs: 5 10*3/uL (ref 1.7–7.7)
Neutrophils Relative %: 56 % (ref 43–77)
PLATELETS: 280 10*3/uL (ref 150–400)
RBC: 4.59 MIL/uL (ref 3.87–5.11)
RDW: 16.5 % — ABNORMAL HIGH (ref 11.5–15.5)
WBC: 9 10*3/uL (ref 4.0–10.5)

## 2014-01-23 LAB — COMPREHENSIVE METABOLIC PANEL
ALBUMIN: 3.7 g/dL (ref 3.5–5.2)
ALK PHOS: 68 U/L (ref 39–117)
ALT: 13 U/L (ref 0–35)
AST: 14 U/L (ref 0–37)
Anion gap: 13 (ref 5–15)
BUN: 12 mg/dL (ref 6–23)
CHLORIDE: 101 meq/L (ref 96–112)
CO2: 27 mEq/L (ref 19–32)
Calcium: 9.6 mg/dL (ref 8.4–10.5)
Creatinine, Ser: 1 mg/dL (ref 0.50–1.10)
GFR calc Af Amer: 80 mL/min — ABNORMAL LOW (ref >90)
GFR calc non Af Amer: 69 mL/min — ABNORMAL LOW (ref >90)
Glucose, Bld: 99 mg/dL (ref 70–99)
POTASSIUM: 3.7 meq/L (ref 3.7–5.3)
Sodium: 141 mEq/L (ref 137–147)
Total Protein: 7.4 g/dL (ref 6.0–8.3)

## 2014-01-23 LAB — TROPONIN I

## 2014-01-23 MED ORDER — GI COCKTAIL ~~LOC~~
30.0000 mL | Freq: Once | ORAL | Status: AC
  Administered 2014-01-23: 30 mL via ORAL
  Filled 2014-01-23: qty 30

## 2014-01-23 MED ORDER — SODIUM CHLORIDE 0.9 % IV BOLUS (SEPSIS)
500.0000 mL | Freq: Once | INTRAVENOUS | Status: AC
  Administered 2014-01-23: 500 mL via INTRAVENOUS

## 2014-01-23 MED ORDER — ACETAMINOPHEN 325 MG PO TABS
650.0000 mg | ORAL_TABLET | Freq: Once | ORAL | Status: AC
  Administered 2014-01-23: 650 mg via ORAL
  Filled 2014-01-23: qty 2

## 2014-01-23 NOTE — ED Provider Notes (Signed)
CSN: [id number]     Arrival date & time 01/23/14  2112 History  This chart was scribed for Junius Argyle, MD by Evon Slack, ED Scribe. This patient was seen in room MH11/MH11 and the patient's care was started at 9:49 PM.      Chief Complaint  Patient presents with  . Chest Pain   Patient is a 43 y.o. female presenting with chest pain. The history is provided by the patient. No language interpreter was used.  Chest Pain Pain quality: pressure   Pain radiates to:  Does not radiate Pain radiates to the back: no   Pain severity:  Moderate Duration:  1 day Timing:  Intermittent Progression:  Unchanged Relieved by:  Nothing Worsened by:  Coughing Ineffective treatments:  None tried Associated symptoms: cough, nausea, numbness and shortness of breath   Associated symptoms: no abdominal pain, no back pain, no fever, no headache and not vomiting    HPI Comments: Paula Nelson is a 43 y.o. female who presents to the Emergency Department complacining of intermittent chest pain onset this morning. She states the chest pain feels like a pressure. She states that her chest pain is 8/10.  She states she has associated numbness in her right arm, SOB, cough and nausea. She states that coughing makes her chest pain worse. She states she has been feeling SOB for majority of the day. She states she hasn't taken any medication for her symptoms prior to arrival. She states she has arm pain that worsens with movement. She states that she is right hand dominant. She states that she drinks occasionally. She denies fever or other related symptoms.   Past Medical History  Diagnosis Date  . Hypertension   . Palpitations    Past Surgical History  Procedure Laterality Date  . Cholecystectomy    . Tubal ligation     Family History  Problem Relation Age of Onset  . Atrial fibrillation Father   . Hypertension Father   . Hypertension Mother    History  Substance Use Topics  . Smoking status:  Never Smoker   . Smokeless tobacco: Not on file  . Alcohol Use: Yes   OB History   Grav Para Term Preterm Abortions TAB SAB Ect Mult Living                 Review of Systems  Constitutional: Negative for fever and chills.  HENT: Negative for rhinorrhea and sore throat.   Eyes: Negative for visual disturbance.  Respiratory: Positive for cough and shortness of breath.   Cardiovascular: Positive for chest pain.  Gastrointestinal: Positive for nausea. Negative for vomiting, abdominal pain and diarrhea.  Genitourinary: Negative for dysuria.  Musculoskeletal: Negative for back pain and neck pain.  Skin: Negative for rash.  Allergic/Immunologic: Negative for immunocompromised state.  Neurological: Positive for numbness. Negative for headaches.  Hematological: Negative for adenopathy.  Psychiatric/Behavioral: Negative for confusion.  All other systems reviewed and are negative.     Allergies  Review of patient's allergies indicates no known allergies.  Home Medications   Prior to Admission medications   Medication Sig Start Date End Date Taking? Authorizing Provider  benazepril (LOTENSIN) 20 MG tablet Take 1 tablet (20 mg total) by mouth 2 (two) times daily. 08/13/12   Rollene Rotunda, MD  ibuprofen (ADVIL,MOTRIN) 600 MG tablet Take 1 tablet (600 mg total) by mouth every 6 (six) hours as needed for pain. 08/05/12   Loren Racer, MD  methocarbamol (ROBAXIN) 500 MG tablet  Take 1 tablet (500 mg total) by mouth 2 (two) times daily. 08/05/12   Loren Raceravid Yelverton, MD  metroNIDAZOLE (FLAGYL) 500 MG tablet Take 1 tablet (500 mg total) by mouth 2 (two) times daily. 05/17/13   Elenora GammaSamuel L Bradshaw, MD   Triage Vitals: BP 156/97  Pulse 92  Temp(Src) 98.3 F (36.8 C) (Oral)  Resp 22  Ht 4\' 11"  (1.499 m)  Wt 268 lb (121.564 kg)  BMI 54.10 kg/m2  SpO2 100%  LMP 01/10/2014  Physical Exam  Nursing note and vitals reviewed. Constitutional: She is oriented to person, place, and time. She appears  well-developed and well-nourished. No distress.  HENT:  Head: Normocephalic and atraumatic.  Mouth/Throat: Oropharynx is clear and moist.  Eyes: Conjunctivae and EOM are normal. Pupils are equal, round, and reactive to light.  Neck: Normal range of motion. Neck supple.  Cardiovascular: Normal rate, regular rhythm and normal heart sounds.   Pulmonary/Chest: Effort normal. No respiratory distress. She exhibits tenderness.  Bilateral anterior chest wall tenderness to palpation  Abdominal: Soft. Bowel sounds are normal. She exhibits no distension and no mass. There is no tenderness. There is no rebound and no guarding.  Musculoskeletal: Normal range of motion. She exhibits tenderness.  Focal tenderness to palpation to the right lateral distal arm. Pain in this area with flexion of the right arm  Neurological: She is alert and oriented to person, place, and time.  alert, oriented x3 speech: normal in context and clarity memory: intact grossly cranial nerves II-XII: intact motor strength: full proximally and distally no involuntary movements or tremors sensation: intact to light touch diffusely  Gait: normal cerebellar: finger-to-nose and heel-to-shin intact    Skin: Skin is warm and dry.  Psychiatric: She has a normal mood and affect. Her behavior is normal.    ED Course  Procedures (including critical care time) DIAGNOSTIC STUDIES: Oxygen Saturation is 100% on RA, normal by my interpretation.    COORDINATION OF CARE:    Labs Review Labs Reviewed  CBC WITH DIFFERENTIAL  COMPREHENSIVE METABOLIC PANEL  TROPONIN I    Imaging Review Dg Chest 2 View  01/23/2014   CLINICAL DATA:  Chest pain and shortness of breath. Right arm cramps.  EXAM: CHEST  2 VIEW  COMPARISON:  Chest radiograph performed 08/05/2012  FINDINGS: The lungs are well-aerated and clear. There is no evidence of focal opacification, pleural effusion or pneumothorax.  The heart is borderline normal in size; the  mediastinal contour is within normal limits. No acute osseous abnormalities are seen. Clips are noted within the right upper quadrant, reflecting prior cholecystectomy.  IMPRESSION: No acute cardiopulmonary process seen.   Electronically Signed   By: Roanna RaiderJeffery  Chang M.D.   On: 01/23/2014 21:43     EKG Interpretation   Date/Time:  Thursday January 23 2014 21:28:42 EDT Ventricular Rate:  90 PR Interval:  130 QRS Duration: 80 QT Interval:  372 QTC Calculation: 455 R Axis:   42 Text Interpretation:  Normal sinus rhythm No significant change since last  tracing Confirmed by Mateen Franssen  MD, Nakul Avino ([id number]) on 01/23/2014 9:34:33 PM      MDM   Final diagnoses:  Other chest pain  Right arm pain    10:22 PM 43 y.o. female who presents with atypical chest pain and shortness of breath. She states that the chest pain feels like a pressure in the middle of her chest and comes and goes intermittently lasting a few minutes at a time. Nothing seems to make it worse  including exertion. She does have chest wall tenderness on exam she states that this is the same pain that she is having.Wells/Perc neg. she has few risk factors for heart disease including hypertension. She's not a smoker and has no family history of heart disease. She is currently having pain on exam. She also notes some chronic paresthesias and pain in her right lateral biceps which is worse with flexion and also easily reproduced with palpation on exam. Will get Tylenol and a GI cocktail and get screening labwork and imaging.  11:08 PM: Pt feeling much better. Doubt cardiac cause of pain. Low risk for MACE per HEART score. Arm pain possibly tendonitis given focal ttp and worse w/ flexion, this pain seems to be chronic. I have discussed the diagnosis/risks/treatment options with the patient and believe the pt to be eligible for discharge home to follow-up with pcp. We also discussed returning to the ED immediately if new or worsening sx occur. We  discussed the sx which are most concerning (e.g., worsening cp or sob, fever) that necessitate immediate return. Medications administered to the patient during their visit and any new prescriptions provided to the patient are listed below.  Medications given during this visit Medications  acetaminophen (TYLENOL) tablet 650 mg (650 mg Oral Given 01/23/14 2209)  gi cocktail (Maalox,Lidocaine,Donnatal) (30 mLs Oral Given 01/23/14 2210)  sodium chloride 0.9 % bolus 500 mL (500 mLs Intravenous New Bag/Given 01/23/14 2210)    New Prescriptions   No medications on file       I personally performed the services described in this documentation, which was scribed in my presence. The recorded information has been reviewed and is accurate.      Junius Argyle, MD 01/23/14 [phone]

## 2014-01-23 NOTE — ED Notes (Signed)
C/o CP , SOB started this am-right arm cramps x 2 months

## 2014-01-23 NOTE — Discharge Instructions (Signed)

## 2014-02-21 ENCOUNTER — Encounter: Payer: Self-pay | Admitting: Cardiology

## 2014-02-21 ENCOUNTER — Ambulatory Visit (INDEPENDENT_AMBULATORY_CARE_PROVIDER_SITE_OTHER): Payer: BC Managed Care – PPO | Admitting: Cardiology

## 2014-02-21 VITALS — BP 138/92 | HR 76 | Ht 59.0 in | Wt 172.3 lb

## 2014-02-21 DIAGNOSIS — G4710 Hypersomnia, unspecified: Secondary | ICD-10-CM

## 2014-02-21 DIAGNOSIS — R079 Chest pain, unspecified: Secondary | ICD-10-CM

## 2014-02-21 DIAGNOSIS — D509 Iron deficiency anemia, unspecified: Secondary | ICD-10-CM

## 2014-02-21 DIAGNOSIS — D649 Anemia, unspecified: Secondary | ICD-10-CM

## 2014-02-21 DIAGNOSIS — R682 Dry mouth, unspecified: Secondary | ICD-10-CM

## 2014-02-21 DIAGNOSIS — R0609 Other forms of dyspnea: Secondary | ICD-10-CM

## 2014-02-21 DIAGNOSIS — K117 Disturbances of salivary secretion: Secondary | ICD-10-CM

## 2014-02-21 DIAGNOSIS — R5381 Other malaise: Secondary | ICD-10-CM

## 2014-02-21 DIAGNOSIS — R5383 Other fatigue: Secondary | ICD-10-CM

## 2014-02-21 DIAGNOSIS — Z79899 Other long term (current) drug therapy: Secondary | ICD-10-CM

## 2014-02-21 DIAGNOSIS — R0683 Snoring: Secondary | ICD-10-CM

## 2014-02-21 DIAGNOSIS — G4730 Sleep apnea, unspecified: Secondary | ICD-10-CM

## 2014-02-21 DIAGNOSIS — R0989 Other specified symptoms and signs involving the circulatory and respiratory systems: Secondary | ICD-10-CM

## 2014-02-21 LAB — CBC
HCT: 31.9 % — ABNORMAL LOW (ref 36.0–46.0)
HEMOGLOBIN: 9.6 g/dL — AB (ref 12.0–15.0)
MCH: 19.8 pg — AB (ref 26.0–34.0)
MCHC: 30.1 g/dL (ref 30.0–36.0)
MCV: 65.6 fL — ABNORMAL LOW (ref 78.0–100.0)
Platelets: 265 10*3/uL (ref 150–400)
RBC: 4.86 MIL/uL (ref 3.87–5.11)
RDW: 17.1 % — ABNORMAL HIGH (ref 11.5–15.5)
WBC: 5.8 10*3/uL (ref 4.0–10.5)

## 2014-02-21 LAB — RETICULOCYTES
ABS RETIC: 68 10*3/uL (ref 19.0–186.0)
RBC.: 4.86 MIL/uL (ref 3.87–5.11)
RETIC CT PCT: 1.4 % (ref 0.4–2.3)

## 2014-02-21 LAB — IRON AND TIBC
%SAT: 4 % — ABNORMAL LOW (ref 20–55)
IRON: 19 ug/dL — AB (ref 42–145)
TIBC: 497 ug/dL — ABNORMAL HIGH (ref 250–470)
UIBC: 478 ug/dL — ABNORMAL HIGH (ref 125–400)

## 2014-02-21 LAB — BASIC METABOLIC PANEL
BUN: 11 mg/dL (ref 6–23)
CO2: 25 mEq/L (ref 19–32)
Calcium: 9.5 mg/dL (ref 8.4–10.5)
Chloride: 107 mEq/L (ref 96–112)
Creat: 0.86 mg/dL (ref 0.50–1.10)
GLUCOSE: 87 mg/dL (ref 70–99)
Potassium: 4.5 mEq/L (ref 3.5–5.3)
Sodium: 141 mEq/L (ref 135–145)

## 2014-02-21 MED ORDER — HYDROCHLOROTHIAZIDE 12.5 MG PO CAPS: 12.5000 mg | ORAL_CAPSULE | Freq: Every day | ORAL | Status: DC

## 2014-02-21 MED ORDER — BENAZEPRIL HCL 20 MG PO TABS: 20.0000 mg | ORAL_TABLET | Freq: Two times a day (BID) | ORAL | Status: DC

## 2014-02-21 MED ORDER — DOCUSATE SODIUM 100 MG PO CAPS
100.0000 mg | ORAL_CAPSULE | Freq: Two times a day (BID) | ORAL | Status: DC
Start: 2014-02-21 — End: 2014-04-01

## 2014-02-21 MED ORDER — FERROUS SULFATE 324 (65 FE) MG PO TBEC: 324.0000 mg | DELAYED_RELEASE_TABLET | Freq: Every day | ORAL | Status: DC

## 2014-02-21 NOTE — Patient Instructions (Signed)
Your physician has recommended that you have a sleep study. This test records several body functions during sleep, including: brain activity, eye movement, oxygen and carbon dioxide blood levels, heart rate and rhythm, breathing rate and rhythm, the flow of air through your mouth and nose, snoring, body muscle movements, and chest and belly movement.  LABS TODAY-- CBC BMET, IRON, TIBC, RETIC  2-3 DAYS PRIOR TO NEXT APPOINTMENT- CBC, BMET  NEW MEDICATION - OVER THE COUNTER-- FERROUS SULFATE- 324 MG ONE DAILY     " STOOL SOFTNER 100 MG ONCE DAILY    START HCTZ 12.5 MG one tablet daily  Your physician wants you to follow-up in 4 weeks with Dr Antoine [facility]HOCHREIN [facility]  You will receive a reminder letter in the mail two months in advance. If you don't receive a letter, please call our office to schedule the follow-up appointment.

## 2014-02-21 NOTE — Progress Notes (Signed)
02/21/2014 Paula Nelson   25-Feb-1971  [id number]  Primary Physicia Paula RubensteinSUN,[name], MD Primary Cardiologist: Dr. Antoine Nelson  HPI:  The patient is a 43 year old female, followed by Dr. Antoine Nelson, with a history of treated hypertension. Her last office visit with Dr. Antoine Nelson was 10/05/2012. At that time, she presented for evaluation of palpitations. She was evaluated with a Holter monitor which demonstrated sinus rhythm without any ectopy. She also underwent a POET during that time for evaluation for ischemia. This was a normal study w/o evidence of ischemia.   She presents to clinic today for followup after recently being evaluated in the emergency department with complaints of atypical chest pain. She was evaluated 01/23/14 by Dr. Purvis SheffieldForrest Nelson in the [facility] ED. According to his note, she endorsored intermittent substernal chest pressure lasting. Short in duration, lasting only 1-2 minutes at a time.  It was also mentioned that her physical exam was notable for chest wall tenderness with palpation. This reporduced the same pain she was endorsing. She also noted associated right arm pain exacerbated by forward flexion. Per ED note, she was low risk for MACE per HEART score. Wells/Perc was also negative. Troponin was negative x 1. EKG demonstrated normal sinus rhythm and tracings were unchanged compared to prior EKG. CBC revealed anemia with Hgb of 9.3. CXR was unremarkable. The ED physician did not feel her symptoms were cardiac in etiology. She was treated with Tylenol and a GI cocktail and was discharged from the ED. She was instructed to f/u in our office.   Today in clinic, she states that her chest pain has resolved but she continues to have some right arm discomfort, worse with movement and with palpation over the lateral epicondyle. Her main complaint now is constant fatigue, decrease exercise tolerance, and DOE. Symptoms have been ongoing for several months. She states she gives out eaily during  light physical activity, such as walking to and from her mail box. She denies dyspnea at rest. No orthopnea, PND or LEE. She denies melena. No h/o PUD and no chronic NSAID use. No menorrhagia. LMP was 3 weeks ago. She notes pica, in which she is constantly craving ice.  She also notes that she has been told that she snores when she sleeps and has been observed by her children to have apneic spells. She has never undergone a sleep study.   Also, her BP today is elevated at 140/100. This was later rechecked near the end of visit, after patient had been resting in a chair with feet flat on the floor. Her DBP remained elevated at 90. She states that she was on HCTZ in the past, but ran out of refills. She has been taking benazepril daily.    Current Outpatient Prescriptions  Medication Sig Dispense Refill  . benazepril (LOTENSIN) 20 MG tablet Take 1 tablet (20 mg total) by mouth 2 (two) times daily.  60 tablet  6  . ibuprofen (ADVIL,MOTRIN) 600 MG tablet Take 1 tablet (600 mg total) by mouth every 6 (six) hours as needed for pain.  30 tablet  0  . methocarbamol (ROBAXIN) 500 MG tablet Take 1 tablet (500 mg total) by mouth 2 (two) times daily.  20 tablet  0  . metroNIDAZOLE (FLAGYL) 500 MG tablet Take 1 tablet (500 mg total) by mouth 2 (two) times daily.  14 tablet  0   No current facility-administered medications for this visit.    No Known Allergies  History   Social History  . Marital  Status: Married    Spouse Name: N/A    Number of Children: 3  . 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 Smoking status: Never Smoker   . Smokeless tobacco: Not on file  . Alcohol Use: Yes  . Drug Use: No  . Sexual Activity: Not on file   Other Topics Concern  . Not on file   Social History Narrative   Lives at home with two teenagers.       Review of Systems: General: negative for chills, fever, night sweats or weight changes.    Cardiovascular: negative for chest pain, dyspnea on exertion, edema, orthopnea, palpitations, paroxysmal nocturnal dyspnea or shortness of breath Dermatological: negative for rash Respiratory: negative for cough or wheezing Urologic: negative for hematuria Abdominal: negative for nausea, vomiting, diarrhea, bright red blood per rectum, melena, or hematemesis Neurologic: negative for visual changes, syncope, or dizziness All other systems reviewed and are otherwise negative except as noted above.    Blood pressure 140/100, pulse 76, height 4\' 11"  (1.499 m), weight 172 lb 4.8 oz (78.155 kg), last menstrual period 01/10/2014.  General appearance: alert, cooperative, no distress and moderately obese Neck: no carotid bruit and no JVD Lungs: clear to auscultation bilaterally Heart: regular rate and rhythm, S1, S2 normal, no murmur, click, rub or gallop Extremities: no LEE Pulses: 2+ and symmetric Skin: warm and dry Neurologic: Grossly normal  EKG NSR with non specific Twave abnormality  ASSESSMENT AND PLAN:   1. Chest Pain: Patient recently presented to ED with atypical chest pain. Workup was fairly unremarkable including negative cardiac enzymes, nonischemic EKGs and unremarkable chest x-ray. He had a low HEART score and Wells/Perc were both negative. Symptoms seem more consistent with MSK pain and have improved with PRN tylenol. No further w/u indicated at this time.  2. Fatigue/SOB: Per recent lab work obtained in ER during CP w/u, she is anemic with last documented Hgb of 9.3. No h/o melena. MCV was also low, consistent with microcytic anemia.  In a menstruating female, this is likely iron deficiency anemia. We will repeat a CBC today to ensure she has had no further decreases in her Hgb. Will also check Feritin, TIBC and retic count. Start 325 mg of ferous sulfate + dialy stool softener to prevent constipation. Avoid NSAIDs. Repeat lab w/o in 3-4 weeks to assess response to therapy.   3.  ?OSA: will order sleep study.   4. HTN: BP is elevated. Restart HCTZ 12.5. Continue ACE. Check BMP to ensure renal function and electrolytes are both ok. Recheck in 3-4 weeks after restarting HTCZ.   PLAN  Details outlined above. Return in 3-4 weeks with either Dr. Antoine Poche or myself for re-evaluation.   Emberlee Sortino, BRITTAINYPA-C 02/21/2014 8:28 AM

## 2014-02-24 ENCOUNTER — Telehealth: Payer: Self-pay | Admitting: *Deleted

## 2014-02-24 NOTE — Telephone Encounter (Signed)
Pt. Called and informed that her HGB was ok and to keep taking her Iron supplement, pt stated understanding of instructions

## 2014-02-25 DIAGNOSIS — D509 Iron deficiency anemia, unspecified: Secondary | ICD-10-CM | POA: Insufficient documentation

## 2014-04-01 ENCOUNTER — Encounter: Payer: Self-pay | Admitting: Cardiology

## 2014-04-01 ENCOUNTER — Ambulatory Visit (INDEPENDENT_AMBULATORY_CARE_PROVIDER_SITE_OTHER): Payer: BC Managed Care – PPO | Admitting: Cardiology

## 2014-04-01 VITALS — BP 138/90 | HR 84 | Ht 59.0 in | Wt 171.6 lb

## 2014-04-01 DIAGNOSIS — D509 Iron deficiency anemia, unspecified: Secondary | ICD-10-CM

## 2014-04-01 DIAGNOSIS — D649 Anemia, unspecified: Secondary | ICD-10-CM

## 2014-04-01 DIAGNOSIS — R079 Chest pain, unspecified: Secondary | ICD-10-CM

## 2014-04-01 DIAGNOSIS — Z79899 Other long term (current) drug therapy: Secondary | ICD-10-CM

## 2014-04-01 NOTE — Patient Instructions (Signed)
LABS - BMP. IRON AND IBC,CBC  Your physician wants you to follow-up in MIDDLE OF NOV 2015 WITH Paula Nelson.  You will receive a reminder letter in the mail two months in advance. If you don't receive a letter, please call our office to schedule the follow-up appointment.

## 2014-04-01 NOTE — Progress Notes (Signed)
Patient ID: Paula Nelson, female   DOB: [birth year], 43 y.o.   MRN: [id number]    04/01/2014 Paula Nelson   [birth year]  [id number]  Primary Physician: Leanor Rubenstein, MD Primary Cardiologist: Dr. Antoine Poche  HPI:  The patient is a 43 y/o menstruating female, followed by Dr. Antoine Poche, who presents to clinic for 1 month follow-up regarding a number of problems (outlined in detail below). She has a h/o HTN and obesity. She was evaluated in April 2014 for palpitations. A Holter monitor was ordered which demonstrated sinus rhythm w/o any ectopy. She also underwent a POET during that time which was a normal study w/o evidence of ischemia.   I evaluated her for the first time on 01/18/14 for post ED f/u after she was seen for atypical chest pain. W/u was normal in the ED. HEART score was low as well as negative Wells and PERC score.  During my assessment, her complaints and physical exam findings (tenderness with palpation of the chest wall) seemed more consistent with musculoskeletal pain. No further cardiac w/u was indicated. However, she also endorsed constant fatigue, DOE and day time sleepiness. Review on her laboratory work obtained during her ER visit revealed a Hgb at 9.3. This was not addressed in the ED and she denied any prior diagnoses of anemia. Subsequently, I ordered a repeat CBC along with a Feratin and TIBC . Results were suggestive of iron deficiency anemia. Hgb was 9.6. Iron was low at 19 and TIBC was elevated at 497. She noted occasional dark stools but no melena. It was felt that her iron deficiency anemia was likely subsequent to menses. I placed her on 325 mg of ferous sulfate along with a stool softener.   During the office visit, she was also noted to be hypertensive. Her blood pressure was elevated at 140/100. This was later rechecked near the end of visit, after patient had been resting in a chair with feet flat on the floor. Her DBP remained elevated at 90. She stated that she was on  HCTZ in the past, but ran out of refills. She had been taking benazepril daily. I decided to restart her on low-dose hydrochlorothiazide, 12.5 mg daily, along with her benazepril. I ordered a BMET to ensure that both renal function and electrolytes were stable. Serum creatinine was 0.86 and K was 4.5.  Another concern that was addressed during that visit was possible sleep apnea. She reported that she had been observed snoring on a regular basis and reportedly has frequent pulses in her breathing during sleep. Suspicion for obstructive sleep apnea was high given her body habitus as well as her complaints of daytime sleepiness. Subsequently, I ordered for her to be evaluated with a sleep study.  She presents back to clinic today for followup. She reports moderate improvement in fatigue. She still occasionally has dyspnea on exertion and daytime drowsiness. She denies any recurrent chest pain. Her PICA cravings for ice have resolved. She reports full medication compliance. She has been tolerating iron supplementation without constipation. Her blood pressure is better controlled today at 139/90.    Current Outpatient Prescriptions  Medication Sig Dispense Refill  . benazepril (LOTENSIN) 20 MG tablet Take 1 tablet (20 mg total) by mouth 2 (two) times daily.  60 tablet  6  . docusate sodium (COLACE) 100 MG capsule Take 100 mg by mouth 2 (two) times daily as needed.      . ferrous sulfate 324 (65 FE) MG TBEC Take 1 tablet (324 mg total) by  mouth daily.  30 tablet  11  . hydrochlorothiazide (MICROZIDE) 12.5 MG capsule Take 1 capsule (12.5 mg total) by mouth daily.  30 capsule  6  . ibuprofen (ADVIL,MOTRIN) 600 MG tablet Take 1 tablet (600 mg total) by mouth every 6 (six) hours as needed for pain.  30 tablet  0   No current facility-administered medications for this visit.    No Known Allergies  History   Social History  . Marital Status: Married    Spouse Name: N/A    Number of Children: 3  .  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 Smoking status: Never Smoker   . Smokeless tobacco: Not on file  . Alcohol Use: Yes  . Drug Use: No  . Sexual Activity: Not on file   Other Topics Concern  . Not on file   Social History Narrative   Lives at home with two teenagers.       Review of Systems: General: negative for chills, fever, night sweats or weight changes.  Cardiovascular: negative for chest pain, dyspnea on exertion, edema, orthopnea, palpitations, paroxysmal nocturnal dyspnea or shortness of breath Dermatological: negative for rash Respiratory: negative for cough or wheezing Urologic: negative for hematuria Abdominal: negative for nausea, vomiting, diarrhea, bright red blood per rectum, melena, or hematemesis Neurologic: negative for visual changes, syncope, or dizziness All other systems reviewed and are otherwise negative except as noted above.    Blood pressure 138/90, pulse 84, height 4\' 11"  (1.499 m), weight 171 lb 9.6 oz (77.837 kg).  General appearance: alert, cooperative, no distress and moderately obese Neck: no carotid bruit and no JVD Lungs: clear to auscultation bilaterally Heart: regular rate and rhythm, S1, S2 normal, no murmur, click, rub or gallop Extremities: no LEE Pulses: 2+ and symmetric Skin: warm and dry Neurologic: Grossly normal  EKG NSR with non specific Twave abnormality  ASSESSMENT AND PLAN:   1. Musculoskeletal Chest Pain:  Resolved.  2. Iron Deficiency Anemia: Symptoms moderately improved. Continue daily Fe supplementation. Recheck CBC to assess H/H along with Feratin and TIBC to assess response to treatment. Will increase of ferous sulfate if needed. Continue PRN stool softener to prevent constipation.   3. ?OSA: [facility].   4. HTN: BP improved. Continue HCTZ and benazepril. Repeat BMET to reassess renal function and electrolytes.    PLAN  Return for follow-up in 4-6 weeks after sleep study is conducted. Continue treatment plan as outlined above.   SIMMONS, BRITTAINYPA-C 04/01/2014 9:15 AM

## 2014-04-04 LAB — CBC
HEMATOCRIT: 36.4 % (ref 36.0–46.0)
Hemoglobin: 11 g/dL — ABNORMAL LOW (ref 12.0–15.0)
MCH: 22 pg — ABNORMAL LOW (ref 26.0–34.0)
MCHC: 30.2 g/dL (ref 30.0–36.0)
MCV: 72.9 fL — ABNORMAL LOW (ref 78.0–100.0)
Platelets: 293 10*3/uL (ref 150–400)
RBC: 4.99 MIL/uL (ref 3.87–5.11)
RDW: 21.3 % — AB (ref 11.5–15.5)
WBC: 4.7 10*3/uL (ref 4.0–10.5)

## 2014-04-04 LAB — BASIC METABOLIC PANEL
BUN: 8 mg/dL (ref 6–23)
CO2: 25 mEq/L (ref 19–32)
Calcium: 9.4 mg/dL (ref 8.4–10.5)
Chloride: 105 mEq/L (ref 96–112)
Creat: 0.81 mg/dL (ref 0.50–1.10)
GLUCOSE: 77 mg/dL (ref 70–99)
POTASSIUM: 4.2 meq/L (ref 3.5–5.3)
SODIUM: 137 meq/L (ref 135–145)

## 2014-04-04 LAB — IRON AND TIBC
%SAT: 4 % — AB (ref 20–55)
Iron: 18 ug/dL — ABNORMAL LOW (ref 42–145)
TIBC: 404 ug/dL (ref 250–470)
UIBC: 386 ug/dL (ref 125–400)

## 2014-04-29 ENCOUNTER — Ambulatory Visit (HOSPITAL_BASED_OUTPATIENT_CLINIC_OR_DEPARTMENT_OTHER): Payer: BC Managed Care – PPO | Attending: Cardiology | Admitting: Radiology

## 2014-04-29 VITALS — Ht 59.0 in | Wt 172.0 lb

## 2014-04-29 DIAGNOSIS — G4733 Obstructive sleep apnea (adult) (pediatric): Secondary | ICD-10-CM

## 2014-04-29 DIAGNOSIS — R5383 Other fatigue: Secondary | ICD-10-CM

## 2014-04-29 DIAGNOSIS — R079 Chest pain, unspecified: Secondary | ICD-10-CM

## 2014-04-29 DIAGNOSIS — G4730 Sleep apnea, unspecified: Secondary | ICD-10-CM | POA: Diagnosis present

## 2014-04-29 DIAGNOSIS — G478 Other sleep disorders: Secondary | ICD-10-CM | POA: Diagnosis not present

## 2014-04-29 DIAGNOSIS — G4710 Hypersomnia, unspecified: Secondary | ICD-10-CM

## 2014-04-29 DIAGNOSIS — R682 Dry mouth, unspecified: Secondary | ICD-10-CM

## 2014-04-29 DIAGNOSIS — D649 Anemia, unspecified: Secondary | ICD-10-CM

## 2014-04-29 DIAGNOSIS — Z79899 Other long term (current) drug therapy: Secondary | ICD-10-CM

## 2014-04-29 DIAGNOSIS — R0683 Snoring: Secondary | ICD-10-CM

## 2014-05-04 DIAGNOSIS — G4710 Hypersomnia, unspecified: Secondary | ICD-10-CM

## 2014-05-04 NOTE — Sleep Study (Signed)
   NAME: [name] DATE OF BIRTH:  May 24, 1971 MEDICAL RECORD NUMBER [id number]  LOCATION: [facility]  PHYSICIAN: KELLY,THOMAS A  DATE OF STUDY: 04/29/2014  SLEEP STUDY TYPE: Nocturnal Polysomnogram               REFERRING PHYSICIAN: Allayne ButcherSimmons, Brittainy M, P*  INDICATION FOR STUDY: missed Paula Nelson is a 43 year old female who is referred by Boyce MediciBrittany Simmons, PA-C and is followed by Dr. Marcy SirenV Sun, for primary care for evaluation of sleep apnea, daytime sleepiness,and non-restorative sleep. She has a history of hypertension as well as obesity and has had witnessed apnea.  EPWORTH SLEEPINESS SCORE:   HEIGHT: 4\' 11"  (149.9 cm)  WEIGHT: 172 lb (78.019 kg)    Body mass index is 34.72 kg/(m^2).  NECK SIZE: 13 in.  MEDICATIONS:  ibuprofen (ADVIL,MOTRIN) 600 MG tablet 600 mg, Every 6 hours PRN hydrochlorothiazide (MICROZIDE) 12.5 MG capsule 12.5 mg, Daily ferrous sulfate 324 (65 FE) MG TBEC 324 mg, Daily docusate sodium (COLACE) 100 MG capsule 100 mg, 2 times daily PRN benazepril    SLEEP ARCHITECTURE:  The recording began at 22:39:30 with lights on at 4:59:00.  Total recording time was 379.5 minutes and total sleep time was 321.5 minutes giving a percent sleep efficiency at 84.7%.  Sleep onset was recorded at 43.5 minutes, which is prolonged.  The patient spent 15 minutes in stage I (4.7%), 236 minutes in stage II (73.4%), 17.5 minutes in stage III (5.4%), and 53 minutes in stage REM (16.5%).  Onset to REM sleep was prolonged at 182.5 minutes.  Sleep architecture is abnormal with prolonged latency to REM sleep.  RESPIRATORY DATA: The Apnea Hypopnea Index (AHI)  during total sleep time was 0.7/hr.  The respiratory disturbance index was 6.7 per hour.  Obstructive sleep apnea was not demonstrated, but the patient had moderate snoring, suggesting increased upper airway resistance (UARS).  OXYGEN DATA: Baseline oxygen saturation was 99%.  The lowest oxygen saturation during REM  sleep was 93% during non-REM sleep was 94%.  CARDIAC DATA: The mean heart rate was 79.2%.  There were rare isolated unifocal PVCs noted throughout the evening.  MOVEMENT/PARASOMNIA: Rare periodic limb movements were observed.  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  Effort should be made to optimize nasal and oral pharyngeal patency.  The patient should be counseled in both good sleep hygiene as well as weight loss procedures, particularly with her body mass index of 34.7, consistent with obesity.  At present, there is no indication for CPAP therapy.  Lennette BihariKELLY,THOMAS A Diplomate, American Board of Sleep Medicine  ELECTRONICALLY SIGNED ON:  05/04/2014, 3:42 PM  [facility] PH: [phone]   FX: [phone] ACCREDITED BY THE AMERICAN ACADEMY OF SLEEP MEDICINE

## 2014-06-04 ENCOUNTER — Telehealth: Payer: Self-pay | Admitting: Cardiology

## 2014-06-04 NOTE — Telephone Encounter (Signed)
Pt had a sleep study at the end of October,still have not received the results.

## 2014-06-04 NOTE — Telephone Encounter (Signed)
Returned call to patient she stated she had sleep study done 04/30/14 and has not been given results.Message sent to Dr.Hochrein.

## 2014-06-18 ENCOUNTER — Telehealth: Payer: Self-pay | Admitting: Cardiology

## 2014-06-18 NOTE — Telephone Encounter (Signed)
Pt called in stating that she has a sleep study done on 10/27 and has not received any results. Please call Thanks

## 2014-06-20 NOTE — Telephone Encounter (Signed)
Dr. Antoine PocheHochrein looks like [place] ordered this sleep study on this patient and she was never given any results. Please review and give to JC to call and notify the patient with recommendations. Thanks.

## 2014-06-23 NOTE — Telephone Encounter (Signed)
No evidence of sleep apnea.  No indication for CPAP.

## 2014-06-24 NOTE — Telephone Encounter (Signed)
Pt. Informed of Dr. Hochreins instructions 

## 2014-08-19 ENCOUNTER — Ambulatory Visit: Payer: Self-pay | Admitting: Cardiology

## 2014-09-22 ENCOUNTER — Encounter: Payer: Self-pay | Admitting: Cardiology

## 2014-09-22 ENCOUNTER — Ambulatory Visit (INDEPENDENT_AMBULATORY_CARE_PROVIDER_SITE_OTHER): Payer: BLUE CROSS/BLUE SHIELD | Admitting: Cardiology

## 2014-09-22 VITALS — BP 128/80 | HR 75 | Ht 59.0 in | Wt 174.9 lb

## 2014-09-22 DIAGNOSIS — I10 Essential (primary) hypertension: Secondary | ICD-10-CM

## 2014-09-22 NOTE — Progress Notes (Signed)
Cardiology Office Note   Date:  09/22/2014   ID:  Paula Nelson, DOB 06/26/1971, MRN [id number]  PCP:  Leanor Rubenstein, MD  Cardiologist:  Dr. Antoine Poche    Chief Complaint  Patient presents with  . doe    dyspnea with activity. dizziness on occasion.       History of Present Illness: Paula Nelson is a 44 y.o. female who presents for DOE and results of sleep study and follow up of HTN.      She has no chest pain.  She has not been exercising.  We discussed importance of adding 10 min daily and her DOE should improve but if it were to increase to let us know.   She did complete her sleep study and she does not have sleep apnea.  We discussed loosing weight and sleeping on her side and she may need to see ENT if her tonsils are obstructing her breathing at night.  Mostly she was reassured.  No palpitations.    Past Medical History  Diagnosis Date  . Hypertension   . Palpitations     Past Surgical History  Procedure Laterality Date  . Cholecystectomy    . Tubal ligation       Current Outpatient Prescriptions  Medication Sig Dispense Refill  . benazepril (LOTENSIN) 20 MG tablet Take 1 tablet (20 mg total) by mouth 2 (two) times daily. 60 tablet 6  . docusate sodium (COLACE) 100 MG capsule Take 100 mg by mouth 2 (two) times daily as needed.    . ferrous sulfate 324 (65 FE) MG TBEC Take 1 tablet (324 mg total) by mouth daily. 30 tablet 11  . hydrochlorothiazide (MICROZIDE) 12.5 MG capsule Take 1 capsule (12.5 mg total) by mouth daily. 30 capsule 6  . ibuprofen (ADVIL,MOTRIN) 600 MG tablet Take 1 tablet (600 mg total) by mouth every 6 (six) hours as needed for pain. 30 tablet 0   No current facility-administered medications for this visit.    Allergies:   Review of patient's allergies indicates no known allergies.    Social History:  The patient  reports that she has never smoked. She does not have any smokeless tobacco history on file. She reports that she drinks  alcohol. She reports that she does not use illicit drugs.   Family History:  The patient's family history includes Atrial fibrillation in her father; Hypertension in her father and mother.    ROS:  General:no colds or fevers, no weight changes Skin:no rashes or ulcers HEENT:no blurred vision, no congestion CV:see HPI PUL:see HPI GI:no diarrhea constipation or melena, no indigestion GU:no hematuria, no dysuria MS:no joint pain, no claudication Neuro:no syncope, no lightheadedness Endo:no diabetes, no thyroid disease  Wt Readings from Last 3 Encounters:  09/22/14 174 lb 14.4 oz (79.334 kg)  04/29/14 172 lb (78.019 kg)  04/01/14 171 lb 9.6 oz (77.837 kg)     PHYSICAL EXAM: VS:  BP 128/80 mmHg  Pulse 75  Ht  (1.499 m)  Wt 174 lb 14.4 oz (79.334 kg)  BMI 35.31 kg/m2 , BMI Body mass index is 35.31 kg/(m^2). General:Pleasant affect, NAD Skin:Warm and dry, brisk capillary refill HEENT:normocephalic, sclera clear, mucus membranes moist Neck:supple, no JVD, no bruits  Heart:S1S2 RRR without murmur, gallup, rub or click Lungs:clear without rales, rhonchi, or wheezes XLK:GMWN, non tender, + BS, do not palpate liver spleen or masses Ext:no lower ext edema, 2+ pedal pulses, 2+ radial pulses Neuro:alert and oriented, MAE, follows commands, + facial symmetry  EKG:  EKG is ordered today. The ekg ordered today demonstrates SR rate of 75 normal EKG and no changes from previous.     Recent Labs: 01/23/2014: ALT 13 04/04/2014: BUN 8; Creatinine 0.81; Hemoglobin 11.0*; Platelets 293; Potassium 4.2; Sodium 137    Lipid Panel No results found for: CHOL, TRIG, HDL, CHOLHDL, VLDL, LDLCALC, LDLDIRECT     Other studies Reviewed: Additional studies/ records that were reviewed today include: previous notes.   ASSESSMENT AND PLAN:  HTN  Stable today, discussed importance of taking her meds eating healthy and exercise.  She stated she is active with her job, I asked her to walk  10,000 steps a day- to obtain pedometer.   She will follow up with Dr. Antoine PocheHochrein in 6 months.   Current medicines are reviewed with the patient today.  The patient has no concerns regarding medicines.  The following changes have been made:  See above Labs/ tests ordered today include:see above  Disposition:   FU:  see above  Nyoka LintSigned, Taavi Hoose R, NP  09/22/2014 8:31 AM    [facility] [address], [place], [place]  [address]/ [address] [place], [place] Phone: [phone]) [phone]; Fax: [phone]) [phone]  [phone]

## 2014-09-22 NOTE — Patient Instructions (Signed)
Your physician wants you to follow-up in: 6 Months with Dr Hochrein. You will receive a reminder letter in the mail two months in advance. If you don't receive a letter, please call our office to schedule the follow-up appointment.  

## 2015-01-13 ENCOUNTER — Emergency Department (HOSPITAL_BASED_OUTPATIENT_CLINIC_OR_DEPARTMENT_OTHER)
Admission: EM | Admit: 2015-01-13 | Discharge: 2015-01-14 | Disposition: A | Discharge status: Home / Self Care | Payer: BLUE CROSS/BLUE SHIELD | Attending: Emergency Medicine | Admitting: Emergency Medicine

## 2015-01-13 ENCOUNTER — Emergency Department (HOSPITAL_BASED_OUTPATIENT_CLINIC_OR_DEPARTMENT_OTHER): Payer: BLUE CROSS/BLUE SHIELD

## 2015-01-13 ENCOUNTER — Encounter (HOSPITAL_BASED_OUTPATIENT_CLINIC_OR_DEPARTMENT_OTHER): Payer: Self-pay | Admitting: *Deleted

## 2015-01-13 DIAGNOSIS — K2100 Gastro-esophageal reflux disease with esophagitis, without bleeding: Secondary | ICD-10-CM

## 2015-01-13 DIAGNOSIS — Z79899 Other long term (current) drug therapy: Secondary | ICD-10-CM | POA: Insufficient documentation

## 2015-01-13 DIAGNOSIS — Z9049 Acquired absence of other specified parts of digestive tract: Secondary | ICD-10-CM | POA: Diagnosis not present

## 2015-01-13 DIAGNOSIS — Z9851 Tubal ligation status: Secondary | ICD-10-CM | POA: Diagnosis not present

## 2015-01-13 DIAGNOSIS — I10 Essential (primary) hypertension: Secondary | ICD-10-CM | POA: Diagnosis not present

## 2015-01-13 DIAGNOSIS — K1120 Sialoadenitis, unspecified: Secondary | ICD-10-CM | POA: Diagnosis not present

## 2015-01-13 DIAGNOSIS — R200 Anesthesia of skin: Secondary | ICD-10-CM | POA: Diagnosis present

## 2015-01-13 MED ORDER — SUCRALFATE 1 G PO TABS
1.0000 g | ORAL_TABLET | Freq: Once | ORAL | Status: AC
  Administered 2015-01-13: 1 g via ORAL
  Filled 2015-01-13: qty 1

## 2015-01-13 NOTE — ED Provider Notes (Signed)
CSN: [id number]     Arrival date & time 01/13/15  2208 History  This chart was scribed for Paula LibraJohn Atianna Haidar, MD by Abel PrestoKara Demonbreun, ED Scribe. This patient was seen in room MH06/MH06 and the patient's care was started at 11:31 PM.    Chief Complaint  Patient presents with  . Numbness    The history is provided by the patient. No language interpreter was used.    HPI HPI Comments: Paula Nelson is a 44 y.o. female who presents to the Emergency Department complaining of left sided facial numbness and tingling with onset today [N.B.: [facility] nurse reports patient indicated this had been present for a month.]. Pt also reports bilateral "swooshing" sound in ears with onset this evening, sinus pressure ("it feels like my face is going to explode"), fullness in chest and dysphagia worse after eating with onset more than two months ago and an episode of vomiting last night. Pt states she felt better after vomiting. Pt denies recent cold, rhinorrhea, and ear pain.   Past Medical History  Diagnosis Date  . Hypertension   . Palpitations    Past Surgical History  Procedure Laterality Date  . Cholecystectomy    . Tubal ligation     Family History  Problem Relation Age of Onset  . Atrial fibrillation Father   . Hypertension Father   . Hypertension Mother    History  Substance Use Topics  . Smoking status: Never Smoker   . Smokeless tobacco: Not on file  . Alcohol Use: Yes   OB History    No data available     Review of Systems A complete 10 system review of systems was obtained and all systems are negative except as noted in the HPI and PMH.     Allergies  Review of patient's allergies indicates no known allergies.  Home Medications   Prior to Admission medications   Medication Sig Start Date End Date Taking? Authorizing Provider  benazepril (LOTENSIN) 20 MG tablet Take 1 tablet (20 mg total) by mouth 2 (two) times daily. 02/21/14   Brittainy Sherlynn CarbonM Simmons, PA-C  docusate sodium (COLACE)  100 MG capsule Take 100 mg by mouth 2 (two) times daily as needed. 02/21/14   Brittainy Sherlynn CarbonM Simmons, PA-C  ferrous sulfate 324 (65 FE) MG TBEC Take 1 tablet (324 mg total) by mouth daily. 02/21/14   Brittainy Sherlynn CarbonM Simmons, PA-C  ibuprofen (ADVIL,MOTRIN) 600 MG tablet Take 1 tablet (600 mg total) by mouth every 6 (six) hours as needed for pain. 08/05/12   Loren Raceravid Yelverton, MD   BP 178/99 mmHg  Pulse 88  Temp(Src) 98.2 F (36.8 C)  Resp 16  Wt 170 lb (77.111 kg)  SpO2 100%  LMP 01/06/2015  Physical Exam General: Well-developed, well-nourished female in no acute distress; appearance consistent with age of record HENT: normocephalic; atraumatic; no tenderness on percussion of sinuses; right TM normal, left TM slightly erythematous with scarring; tenderness of bilateral submaxillary salivary glands Eyes: pupils equal, round and reactive to light; extraocular muscles intact Neck: supple Heart: regular rate and rhythm; no murmurs, rubs or gallops Lungs: clear to auscultation bilaterally Abdomen: soft; nondistended; mild epigastric tenderness; no masses or hepatosplenomegaly; bowel sounds present Extremities: No deformity; full range of motion; pulses normal Neurologic: Awake, alert and oriented; motor function intact in all extremities and symmetric; no facial droop; sensation symmetric but with subjectively altered sensation of the left side of the face Skin: Warm and dry Psychiatric: Normal mood and affect Nursing note and  vitals reviewed.   ED Course  Procedures (including critical care time) DIAGNOSTIC STUDIES: Oxygen Saturation is 100% on room air, normal by my interpretation.    COORDINATION OF CARE: 11:39 PM Discussed treatment plan with patient at beside, the patient agrees with the plan and has no further questions at this time.    MDM  Nursing notes and vitals signs, including pulse oximetry, reviewed.  Summary of this visit's results, reviewed by myself:  Imaging Studies: Ct  Maxillofacial Wo Cm  01/14/2015   CLINICAL DATA:  44 year old female with left-sided facial numbness and tingling.  EXAM: CT MAXILLOFACIAL WITHOUT CONTRAST  TECHNIQUE: Multidetector CT imaging of the maxillofacial structures was performed. Multiplanar CT image reconstructions were also generated. A small metallic BB was placed on the right temple in order to reliably differentiate right from left.  COMPARISON:  None.  FINDINGS: Evaluation of this exam is limited in the absence of intravenous contrast.  There is no acute fracture of the facial bone. There is mild mucoperiosteal thickening of the left maxillary sinus. The remainder of the visualized paranasal sinuses and mastoid air cells are well aerated. The globes and retro-orbital fat are preserved. The visualized brain appears unremarkable. No lymphadenopathy identified.  The right submandibular gland is not well visualized. The There is small amount of fluid tracking along the fascia in the floor of the mouth along the course of the submandibular gland ducts bilaterally. No definite calculus identified. The is no definite inflammatory changes of the left submandibular gland. Ultrasound may provide better evaluation of the salivary glands and duct for small or non radiopaque calculi.  IMPRESSION: Small amount of fluid along the course of the submandibular gland ducts at the floor of the mouth. No definite calculus identified. Sialadenitis is not excluded. Correlation with clinical exam is recommended. Ultrasound may provide better evaluation of the salivary glands and the ducts.   Electronically Signed   By: Elgie Collard M.D.   On: 01/14/2015 00:35   12:55 AM We'll treat for maxillary cell adenitis. There is no evidence of stones on CT and none were palpated on physical exam. We will also treat for GERD.  I personally performed the services described in this documentation, which was scribed in my presence. The recorded information has been reviewed and  is accurate.   Paula Libra, MD 01/14/15 [phone]

## 2015-01-13 NOTE — ED Notes (Signed)
Pt c/o left sided facial numbness x 1 month and left ear ringing x 1 day

## 2015-01-13 NOTE — ED Notes (Signed)
MD at bedside. 

## 2015-01-13 NOTE — ED Notes (Signed)
Pt states that she feels fullness in chest, like her food will not go down her esophagus, reports that face numbness and "swooshing" to bilateral ears started today, denies n/v, sob or dizziness. Steady gait. No pain to chest

## 2015-01-14 MED ORDER — AMOXICILLIN-POT CLAVULANATE 875-125 MG PO TABS: 1.0000 | ORAL_TABLET | Freq: Two times a day (BID) | ORAL | Status: DC

## 2015-01-14 MED ORDER — FLUCONAZOLE 150 MG PO TABS: ORAL_TABLET | ORAL | Status: DC

## 2015-01-14 MED ORDER — LANSOPRAZOLE 15 MG PO TBDP: 15.0000 mg | ORAL_TABLET | Freq: Every day | ORAL | Status: DC

## 2015-01-14 MED ORDER — PANTOPRAZOLE SODIUM 40 MG PO TBEC
40.0000 mg | DELAYED_RELEASE_TABLET | Freq: Once | ORAL | Status: AC
  Administered 2015-01-14: 40 mg via ORAL
  Filled 2015-01-14: qty 1

## 2015-01-14 MED ORDER — AMOXICILLIN-POT CLAVULANATE 875-125 MG PO TABS
1.0000 | ORAL_TABLET | Freq: Once | ORAL | Status: AC
  Administered 2015-01-14: 1 via ORAL
  Filled 2015-01-14: qty 1

## 2015-01-16 ENCOUNTER — Other Ambulatory Visit: Payer: Self-pay | Admitting: *Deleted

## 2015-01-16 ENCOUNTER — Emergency Department (HOSPITAL_COMMUNITY)
Admission: EM | Admit: 2015-01-16 | Discharge: 2015-01-17 | Disposition: A | Discharge status: Home / Self Care | Payer: BLUE CROSS/BLUE SHIELD | Attending: Emergency Medicine | Admitting: Emergency Medicine

## 2015-01-16 ENCOUNTER — Encounter (HOSPITAL_COMMUNITY): Payer: Self-pay | Admitting: Emergency Medicine

## 2015-01-16 DIAGNOSIS — I10 Essential (primary) hypertension: Secondary | ICD-10-CM | POA: Diagnosis not present

## 2015-01-16 DIAGNOSIS — Z79899 Other long term (current) drug therapy: Secondary | ICD-10-CM | POA: Diagnosis not present

## 2015-01-16 DIAGNOSIS — R0602 Shortness of breath: Secondary | ICD-10-CM

## 2015-01-16 MED ORDER — BENAZEPRIL HCL 20 MG PO TABS
20.0000 mg | ORAL_TABLET | Freq: Once | ORAL | Status: DC
  Filled 2015-01-16: qty 1

## 2015-01-16 MED ORDER — IPRATROPIUM-ALBUTEROL 0.5-2.5 (3) MG/3ML IN SOLN
3.0000 mL | RESPIRATORY_TRACT | Status: DC
  Administered 2015-01-17: 3 mL via RESPIRATORY_TRACT
  Filled 2015-01-16: qty 3

## 2015-01-16 MED ORDER — ONDANSETRON 4 MG PO TBDP
4.0000 mg | ORAL_TABLET | Freq: Once | ORAL | Status: AC
  Administered 2015-01-17: 4 mg via ORAL
  Filled 2015-01-16: qty 1

## 2015-01-16 NOTE — ED Notes (Signed)
Diagnosed with maxillary cell adenitis on 7/12.  States she is taking antibiotics and tonight it feels like throat swelling is worse and c/o sob.  NAD at this time.

## 2015-01-17 ENCOUNTER — Emergency Department (HOSPITAL_COMMUNITY): Payer: BLUE CROSS/BLUE SHIELD

## 2015-01-17 LAB — COMPREHENSIVE METABOLIC PANEL
ALBUMIN: 3.5 g/dL (ref 3.5–5.0)
ALK PHOS: 58 U/L (ref 38–126)
ALT: 14 U/L (ref 14–54)
ANION GAP: 8 (ref 5–15)
AST: 18 U/L (ref 15–41)
BUN: 7 mg/dL (ref 6–20)
CALCIUM: 8.9 mg/dL (ref 8.9–10.3)
CO2: 27 mmol/L (ref 22–32)
Chloride: 102 mmol/L (ref 101–111)
Creatinine, Ser: 0.85 mg/dL (ref 0.44–1.00)
GFR calc non Af Amer: >60 mL/min (ref >60)
GLUCOSE: 94 mg/dL (ref 65–99)
POTASSIUM: 3.6 mmol/L (ref 3.5–5.1)
SODIUM: 137 mmol/L (ref 135–145)
Total Bilirubin: 0.3 mg/dL (ref 0.3–1.2)
Total Protein: 6.8 g/dL (ref 6.5–8.1)

## 2015-01-17 LAB — CBC WITH DIFFERENTIAL/PLATELET
BASOS PCT: 1 % (ref 0–1)
Basophils Absolute: 0 10*3/uL (ref 0.0–0.1)
EOS PCT: 3 % (ref 0–5)
Eosinophils Absolute: 0.2 10*3/uL (ref 0.0–0.7)
HCT: 34.5 % — ABNORMAL LOW (ref 36.0–46.0)
Hemoglobin: 10.1 g/dL — ABNORMAL LOW (ref 12.0–15.0)
Lymphocytes Relative: 45 % (ref 12–46)
Lymphs Abs: 2.8 10*3/uL (ref 0.7–4.0)
MCH: 21.1 pg — AB (ref 26.0–34.0)
MCHC: 29.3 g/dL — ABNORMAL LOW (ref 30.0–36.0)
MCV: 72.2 fL — AB (ref 78.0–100.0)
MONOS PCT: 11 % (ref 3–12)
Monocytes Absolute: 0.7 10*3/uL (ref 0.1–1.0)
NEUTROS PCT: 41 % — AB (ref 43–77)
Neutro Abs: 2.5 10*3/uL (ref 1.7–7.7)
Platelets: 246 10*3/uL (ref 150–400)
RBC: 4.78 MIL/uL (ref 3.87–5.11)
RDW: 16 % — ABNORMAL HIGH (ref 11.5–15.5)
WBC: 6.1 10*3/uL (ref 4.0–10.5)

## 2015-01-17 LAB — LIPASE, BLOOD: Lipase: 29 U/L (ref 22–51)

## 2015-01-17 LAB — I-STAT BETA HCG BLOOD, ED (MC, WL, AP ONLY)

## 2015-01-17 MED ORDER — ONDANSETRON 4 MG PO TBDP: 4.0000 mg | ORAL_TABLET | Freq: Three times a day (TID) | ORAL | Status: DC | PRN

## 2015-01-17 MED ORDER — CLINDAMYCIN HCL 300 MG PO CAPS: 300.0000 mg | ORAL_CAPSULE | Freq: Three times a day (TID) | ORAL | Status: DC

## 2015-01-17 NOTE — ED Notes (Signed)
Patient transported to X-ray 

## 2015-01-17 NOTE — Discharge Instructions (Signed)
Shortness of Breath Paula Nelson, your evaluation today was normal. Begin taking clindamycin for your sinus infection. Use Zofran as needed for nausea. See your primary care physician within 3 days for close follow-up. If symptoms worsen come back to emergency department immediately. Thank you. Shortness of breath means you have trouble breathing. Shortness of breath needs medical care right away. HOME CARE   Do not smoke.  Avoid being around chemicals or things (paint fumes, dust) that may bother your breathing.  Rest as needed. Slowly begin your normal activities.  Only take medicines as told by your doctor.  Keep all doctor visits as told. GET HELP RIGHT AWAY IF:   Your shortness of breath gets worse.  You feel lightheaded, pass out (faint), or have a cough that is not helped by medicine.  You cough up blood.  You have pain with breathing.  You have pain in your chest, arms, shoulders, or belly (abdomen).  You have a fever.  You cannot walk up stairs or exercise the way you normally do.  You do not get better in the time expected.  You have a hard time doing normal activities even with rest.  You have problems with your medicines.  You have any new symptoms. MAKE SURE YOU:  Understand these instructions.  Will watch your condition.  Will get help right away if you are not doing well or get worse. Document Released: 12/07/2007 Document Revised: 06/25/2013 Document Reviewed: 09/05/2011 [facility] Patient Information 2015 [place], [place]. This information is not intended to replace advice given to you by your health care provider. Make sure you discuss any questions you have with your health care provider. Sialadenitis Sialadenitis is an inflammation (soreness) of the salivary glands. The parotid is the main salivary gland. It lies behind the angle of the jaw below the ear. The saliva produced comes out of a tiny opening (duct) inside the cheek on either side. This is  usually at the level of the upper back teeth. If it is swollen, the ear is pushed up and out. This helps tell this condition apart from a simple lymph gland infection (swollen glands) in the same area. Mumps has mostly disappeared since the start of immunization against mumps. Now the most common cause of parotitis is germ (bacterial) infection or inflammation of the lymphatics (the lymph channels). The other major salivary gland is located in the floor of the mouth. Smaller salivary glands are located in the mouth. This includes the:  Lips.  Lining of the mouth.  Pharynx.  Hard palate (front part of the roof of the mouth). The salivary glands do many things, including:  Lubrication.  Breaking down food.  Production of hormones and antibodies (to protect against germs which may cause illness).  Help with the sense of taste. ACUTE BACTERIAL SIALADENITIS This is a sudden inflammatory response to bacterial infection. This causes redness, pain, swelling and tenderness over the infected gland. In the past, it was common in dehydrated and debilitated patients often following an operation. It is now more commonly seen:  After radiotherapy.  In patients with poor immune systems. Treatment is:  The correction of fluid balance (rehydration).  Medicine that kill germs (antibiotics).  Pain relief. CHRONIC RECURRENT SIALADENITIS This refers to repeated episodes of discomfort and swelling of one of the salivary glands. It often occurs after eating. Chronic sialadenitis is usually less painful. It is associated with recurrent enlargement of a salivary gland, often following meals, and typically with an absence of redness. The  chronic form of the disease often is associated with conditions linked to decreased salivary flow, rather than dehydration (loss of body fluids). These conditions include:  A stone, or concretion, formed in the gallbladder, kidneys, or other parts of the body  (calculi).  Salivary stasis.  A change in the fluid and electrolyte (the salts in your body fluids) makeup of the gland. It is treated with:  Gland massage.  Methods to stimulate the flow of saliva, (for example, lemon juice).  Antibiotics if required. Surgery to remove the gland is possible, but its benefits need to be balanced against risks.  VIRAL SIALADENITIS Several viruses infect the salivary glands. Some of these include the mumps virus that commonly infects the parotid gland. Other viruses causing problems are:  The HIV virus.  Herpes.  Some of the influenza ("flu") viruses. RECURRENT SIALADENITIS IN CHILDREN This condition is thought to be due to swelling or ballooning of the ducts. It results in the same symptoms as acute bacterial parotitis. It is usually caused by germs (bacteria). It is often treated using penicillin. It may get well without treatment. Surgery is usually not required. TUBERCULOUS SIALADENITIS The salivary glands may become infected with the same bacteria causing tuberculosis ("TB"). Treatment is with anti-tuberculous antibiotic therapy. OTHER UNCOMMON CAUSES OF SIALADENITIS   Sjogren's syndrome is a condition in which arthritis is associated with a decrease in activity of the glands of the body that produce saliva and tears. The diagnosis is made with blood tests or by examination of a piece of tissue from the inside of the lip. Some people with this condition are bothered by:  A dry mouth.  Intermittent salivary gland enlargement.  Atypical mycobacteria is a germ similar to tuberculosis. It often infects children. It is often resistant to antibiotic treatment. It may require surgical treatment to remove the infected salivary gland.  Actinomycosis is an infection of the parotid gland that may also involve the overlying skin. The diagnosis is made by detecting granules of sulphur produced by the bacteria on microscopic examination. Treatment is a  prolonged course of penicillin for up to one year.  Nutritional causes include vitamin deficiencies and bulimia.  Diabetes and problems with your thyroid.  Obesity, cirrhosis, and malabsorption are some metabolic causes. HOME CARE INSTRUCTIONS   Apply ice bags every 2 hours for 15-20 minutes, while awake, to the sore gland for 24 hours, then as directed by your caregiver. Place the ice in a plastic bag with a towel around it to prevent frostbite to the skin.  Only take over-the-counter or prescription medicines for pain, discomfort, or fever as directed by your caregiver. SEEK IMMEDIATE MEDICAL CARE IF:   There is increased pain or swelling in your gland that is not controlled with medicine.  An oral temperature above 102 F (38.9 C) develops, not controlled by medicine.  You develop difficulty opening your mouth, swallowing, or speaking. Document Released: 12/10/2001 Document Revised: 09/12/2011 Document Reviewed: 02/04/2008 [facility] Patient Information 2015 [place], [place]. This information is not intended to replace advice given to you by your health care provider. Make sure you discuss any questions you have with your health care provider.

## 2015-01-17 NOTE — ED Provider Notes (Signed)
CSN: [id number]     Arrival date & time 01/16/15  2333 History   First MD Initiated Contact with Patient 01/16/15 2347     Chief Complaint  Patient presents with  . Oral Swelling  . Shortness of Breath     (Consider location/radiation/quality/duration/timing/severity/associated sxs/prior Treatment) HPI  Paula Nelson is a 44 y.o. female with past medical history of hypertension presenting today with feelings of throat closing and shortness of breath. Patient was recently seen for left-sided facial numbness and sinus pressure. CT scan reveals possible sialadenitis and patient was discharged with Augmentin. She has had trouble keeping this pill down has had nausea and vomiting. In addition she feels as if her throat was tightening and closing. She denies any numbness in her face currently. She only reports congestion. She denies any sore throat, cough, fevers. Patient has no further complaints.  10 Systems reviewed and are negative for acute change except as noted in the HPI.    Past Medical History  Diagnosis Date  . Hypertension   . Palpitations    Past Surgical History  Procedure Laterality Date  . Cholecystectomy    . Tubal ligation     Family History  Problem Relation Age of Onset  . Atrial fibrillation Father   . Hypertension Father   . Hypertension Mother    History  Substance Use Topics  . Smoking status: Never Smoker   . Smokeless tobacco: Not on file  . Alcohol Use: Yes   OB History    No data available     Review of Systems    Allergies  Review of patient's allergies indicates no known allergies.  Home Medications   Prior to Admission medications   Medication Sig Start Date End Date Taking? Authorizing Provider  amoxicillin-clavulanate (AUGMENTIN) 875-125 MG per tablet Take 1 tablet by mouth 2 (two) times daily. One po bid x 7 days 01/14/15   Paula LibraJohn Molpus, MD  benazepril (LOTENSIN) 20 MG tablet Take 1 tablet (20 mg total) by mouth 2 (two) times daily.  02/21/14   Brittainy Sherlynn CarbonM Simmons, PA-C  docusate sodium (COLACE) 100 MG capsule Take 100 mg by mouth 2 (two) times daily as needed. 02/21/14   Brittainy Sherlynn CarbonM Simmons, PA-C  ferrous sulfate 324 (65 FE) MG TBEC Take 1 tablet (324 mg total) by mouth daily. 02/21/14   Brittainy Sherlynn CarbonM Simmons, PA-C  fluconazole (DIFLUCAN) 150 MG tablet Take 1 tablet as needed for vaginal yeast infection. May repeat in 3 days if necessary. 01/14/15   John Molpus, MD  ibuprofen (ADVIL,MOTRIN) 600 MG tablet Take 1 tablet (600 mg total) by mouth every 6 (six) hours as needed for pain. 08/05/12   Loren Raceravid Yelverton, MD  lansoprazole (PREVACID SOLUTAB) 15 MG disintegrating tablet Take 1 tablet (15 mg total) by mouth daily. 01/14/15   John Molpus, MD   BP 174/93 mmHg  Pulse 82  Temp(Src) 98.1 F (36.7 C) (Oral)  Resp 18  SpO2 100%  LMP 01/06/2015 Physical Exam  Constitutional: She is oriented to person, place, and time. She appears well-developed and well-nourished. No distress.  HENT:  Head: Normocephalic and atraumatic.  Right Ear: External ear normal.  Left Ear: External ear normal.  Nose: Nose normal.  Mouth/Throat: Oropharynx is clear and moist. No oropharyngeal exudate.  No oral swelling. Bilateral TMs are normal.  Eyes: Conjunctivae and EOM are normal. Pupils are equal, round, and reactive to light. Right eye exhibits no discharge. Left eye exhibits no discharge. No scleral icterus.  Neck: Normal  range of motion. Neck supple. No JVD present. No tracheal deviation present. No thyromegaly present.  Cardiovascular: Normal rate, regular rhythm and normal heart sounds.  Exam reveals no gallop and no friction rub.   No murmur heard. Pulmonary/Chest: Effort normal and breath sounds normal. No respiratory distress. She has no wheezes. She exhibits no tenderness.  Abdominal: Soft. Bowel sounds are normal. She exhibits no distension and no mass. There is no tenderness. There is no rebound and no guarding.  Musculoskeletal: Normal range  of motion. She exhibits no edema or tenderness.  Lymphadenopathy:    She has no cervical adenopathy.  Neurological: She is alert and oriented to person, place, and time. No cranial nerve deficit. She exhibits normal muscle tone.  Skin: Skin is warm and dry. No rash noted. No erythema. No pallor.  Nursing note and vitals reviewed.   ED Course  Procedures (including critical care time) Labs Review Labs Reviewed  CBC WITH DIFFERENTIAL/PLATELET - Abnormal; Notable for the following:    Hemoglobin 10.1 (*)    HCT 34.5 (*)    MCV 72.2 (*)    MCH 21.1 (*)    MCHC 29.3 (*)    RDW 16.0 (*)    Neutrophils Relative % 41 (*)    All other components within normal limits  COMPREHENSIVE METABOLIC PANEL  LIPASE, BLOOD  I-STAT BETA HCG BLOOD, ED ([facility], [facility], [facility] ONLY)    Imaging Review Dg Chest 2 View  01/17/2015   CLINICAL DATA:  Shortness of breath today. Hypertension. Nonsmoker.  EXAM: CHEST  2 VIEW  COMPARISON:  01/23/2014  FINDINGS: The heart size and mediastinal contours are within normal limits. Both lungs are clear. The visualized skeletal structures are unremarkable.  IMPRESSION: No active cardiopulmonary disease.   Electronically Signed   By: Burman Nieves M.D.   On: 01/17/2015 00:17     EKG Interpretation None      MDM   Final diagnoses:  SOB (shortness of breath)    Patient presents emergency department for feelings of her throat closing and shortness of breath. This is in the setting of recent Augmentin use for sialadenitis.  She's denying any rash or pruritus, I do not believe this is an acute allergic reaction. We'll obtain laboratory studies and chest x-ray as patient is a bounce back to uncover any further etiology. Patient also given Zofran for nausea.  Patient states nausea is improved after Zofran, I will provide a prescription. After breathing treatment she states she still feels short of breath with symptoms of her throat closing. There is no swelling in her oropharynx,  she appears comfortable and in no acute distress. Her oxygen saturation is 100% on RA.  Patient may not be tolerated Augmentin well, we'll switch her to clindamycin and have her follow-up with her primary care physician within 3 days. Her vital signs were within her normal limits, she is asymptomatic from her hypertension and states she has taken her meds today, she is safe for discharge.  Tomasita Crumble, MD 01/17/15 [id number]

## 2015-04-09 ENCOUNTER — Other Ambulatory Visit: Payer: Self-pay | Admitting: Cardiology

## 2015-04-09 MED ORDER — BENAZEPRIL HCL 20 MG PO TABS: 20.0000 mg | ORAL_TABLET | Freq: Two times a day (BID) | ORAL | Status: DC

## 2015-04-09 NOTE — Telephone Encounter (Signed)
Called patient - she needed benazepril refilled instead of hctz  Rx(s) sent to pharmacy electronically.   Patient scheduled to see Dr. Antoine Poche 10/31 @ 0830

## 2015-04-09 NOTE — Telephone Encounter (Signed)
°  1. Which medications need to be refilled? HCTZ(BID)  2. Which pharmacy is medication to be sent to? [facility] in [place], [place]  3. Do they need a 30 day or 90 day supply? 30  4. Would they like a call back once the medication has been sent to the pharmacy? Yes

## 2015-04-13 ENCOUNTER — Other Ambulatory Visit: Payer: Self-pay | Admitting: *Deleted

## 2015-04-13 MED ORDER — BENAZEPRIL HCL 20 MG PO TABS: 20.0000 mg | ORAL_TABLET | Freq: Two times a day (BID) | ORAL | Status: DC

## 2015-05-04 ENCOUNTER — Ambulatory Visit (INDEPENDENT_AMBULATORY_CARE_PROVIDER_SITE_OTHER): Payer: BLUE CROSS/BLUE SHIELD | Admitting: Cardiology

## 2015-05-04 ENCOUNTER — Encounter: Payer: Self-pay | Admitting: Cardiology

## 2015-05-04 VITALS — BP 180/116 | HR 80 | Ht 59.0 in | Wt 177.0 lb

## 2015-05-04 DIAGNOSIS — Z79899 Other long term (current) drug therapy: Secondary | ICD-10-CM | POA: Diagnosis not present

## 2015-05-04 DIAGNOSIS — I10 Essential (primary) hypertension: Secondary | ICD-10-CM | POA: Diagnosis not present

## 2015-05-04 MED ORDER — CHLORTHALIDONE 25 MG PO TABS: 25.0000 mg | ORAL_TABLET | Freq: Every day | ORAL | Status: DC

## 2015-05-04 NOTE — Patient Instructions (Signed)
Your physician recommends that you schedule a follow-up appointment in: As Needed   Your physician has requested that you have an exercise tolerance test. For further information please visit [email or web address]. Please also follow instruction sheet, as given. 1 Month.  Your physician has recommended you make the following change in your medication: START Chlorthalidone 25 mg daily  Your physician recommends that you return for lab work in: 1 Week BMP

## 2015-05-04 NOTE — Progress Notes (Signed)
HPI The patient presents for evaluatio of chest discomfort, hypertension and SOB.  I saw her a few years ago. She had a negative treadmill test. She had a Holter demonstrated no ectopy. She was seen this bring for evaluation of hypertension in our clinic. He been doing relatively well. I see that she was in the emergency room in July she said this was for some chest discomfort but it seems to be more possible throat infection for which she was treated. She continues to have chest pressure. She describes this as occurring all of the time. She feels sometimes like food stays in her chest when she eats. She does not describe heartburn however area she's not bothered by certain foods in particular. She does not get arm discomfort. She does get this burning discomfort that is there somewhat constantly and may get worse when she exercises. She feels her heart beating fast on occasion. She does walk on a treadmill couple of times per week. She's not having any resting shortness of breath, PND or orthopnea.   No Known Allergies  Current Outpatient Prescriptions  Medication Sig Dispense Refill  . benazepril (LOTENSIN) 20 MG tablet Take 1 tablet (20 mg total) by mouth 2 (two) times daily. 60 tablet 1  . ferrous sulfate 324 (65 FE) MG TBEC Take 1 tablet (324 mg total) by mouth daily. 30 tablet 11  . ibuprofen (ADVIL,MOTRIN) 600 MG tablet Take 1 tablet (600 mg total) by mouth every 6 (six) hours as needed for pain. 30 tablet 0  . lansoprazole (PREVACID SOLUTAB) 15 MG disintegrating tablet Take 1 tablet (15 mg total) by mouth daily. 30 tablet 0  . ondansetron (ZOFRAN-ODT) 4 MG disintegrating tablet Take 1 tablet (4 mg total) by mouth every 8 (eight) hours as needed for nausea or vomiting. 12 tablet 0   No current facility-administered medications for this visit.    Past Medical History  Diagnosis Date  . Hypertension   . Palpitations     Past Surgical History  Procedure Laterality Date  .  Cholecystectomy    . Tubal ligation      ROS:    Headaches, dizziness, dyspnea, leg cramping. Otherwise as stated in the history of present illness and negative for all other systems.  PHYSICAL EXAM BP 180/116 mmHg  Pulse 80  Ht  (1.499 m)  Wt 177 lb (80.287 kg)  BMI 35.73 kg/m2 GENERAL:  Well appearing NECK:  No jugular venous distention, waveform within normal limits, carotid upstroke brisk and symmetric, no bruits, no thyromegaly LUNGS:  Clear to auscultation bilaterally BACK:  No CVA tenderness CHEST:  Unremarkable HEART:  PMI not displaced or sustained,S1 and S2 within normal limits, no S3, no S4, no clicks, no rubs, no murmurs ABD:  Flat, positive bowel sounds normal in frequency in pitch, no bruits, no rebound, no guarding, no midline pulsatile mass, no hepatomegaly, no splenomegaly EXT:  2 plus pulses throughout, no edema, no cyanosis no clubbing  ZOX:WRUEA rhythm, rate 80, axis within normal limits, intervals within normal limits, no acute ST-T wave changes.  05/04/2015   ASSESSMENT AND PLAN  Chest pain: This is atypical.  Once I have her blood pressure better controlled I will screen her with a POET (Plain Old Exercise Treadmill) the pretest probability of obstructive coronary disease is low.  Hypertension: I'm going to start chlorthalidone 25 mg.  she was instructed to increase her potassium containing foods. I will check a basic metabolic profile in one week.  Overweight: We had a long discussion about exercise and healthy lifestyles today.  Palpitations: She actually had 0% ectopy on Holter two years ago. She's having some symptoms. However, this probably been a stable pattern. At this point I will not repeat this study.

## 2015-05-13 LAB — BASIC METABOLIC PANEL
BUN: 14 mg/dL (ref 7–25)
CALCIUM: 10.2 mg/dL (ref 8.6–10.2)
CHLORIDE: 97 mmol/L — AB (ref 98–110)
CO2: 25 mmol/L (ref 20–31)
CREATININE: 1.03 mg/dL (ref 0.50–1.10)
Glucose, Bld: 85 mg/dL (ref 65–99)
Potassium: 3.3 mmol/L — ABNORMAL LOW (ref 3.5–5.3)
Sodium: 134 mmol/L — ABNORMAL LOW (ref 135–146)

## 2015-05-22 ENCOUNTER — Telehealth: Payer: Self-pay | Admitting: *Deleted

## 2015-05-22 DIAGNOSIS — Z79899 Other long term (current) drug therapy: Secondary | ICD-10-CM

## 2015-05-22 MED ORDER — POTASSIUM CHLORIDE CRYS ER 20 MEQ PO TBCR: 20.0000 meq | EXTENDED_RELEASE_TABLET | Freq: Every day | ORAL | Status: DC

## 2015-05-22 NOTE — Telephone Encounter (Signed)
Spoke with pt about her blood work, potassium 20 meq was send in to pt pharmacy, BMP was ordered for pt to have done in 2 weeks.

## 2015-05-22 NOTE — Telephone Encounter (Signed)
-----   Message from Rollene RotundaJames Hochrein, MD sent at 05/15/2015  1:42 PM EST ----- Her potassium is low on chlorthalidone.  Please have her start taking 20 meq of potassium.  Check a BMET in two weeks.  Call Ms. Redditt with the results and send results to [name],[name], MD

## 2015-06-03 ENCOUNTER — Telehealth (HOSPITAL_COMMUNITY): Payer: Self-pay

## 2015-06-03 NOTE — Telephone Encounter (Signed)
Encounter complete. 

## 2015-06-05 ENCOUNTER — Ambulatory Visit (HOSPITAL_COMMUNITY)
Admission: RE | Admit: 2015-06-05 | Discharge: 2015-06-05 | Disposition: A | Discharge status: Home / Self Care | Payer: BLUE CROSS/BLUE SHIELD | Source: Ambulatory Visit | Attending: Cardiology | Admitting: Cardiology

## 2015-06-05 DIAGNOSIS — I10 Essential (primary) hypertension: Secondary | ICD-10-CM | POA: Insufficient documentation

## 2015-06-05 DIAGNOSIS — R0789 Other chest pain: Secondary | ICD-10-CM | POA: Diagnosis not present

## 2015-06-05 LAB — EXERCISE TOLERANCE TEST
CSEPEW: 7.8 METS
CSEPHR: 91 %
Exercise duration (min): 6 min
Exercise duration (sec): 34 s
MPHR: 178 {beats}/min
Peak HR: 162 {beats}/min
RPE: 17
Rest HR: 79 {beats}/min

## 2015-07-15 ENCOUNTER — Encounter (HOSPITAL_COMMUNITY): Payer: Self-pay | Admitting: Emergency Medicine

## 2015-07-15 ENCOUNTER — Emergency Department (HOSPITAL_COMMUNITY): Payer: BLUE CROSS/BLUE SHIELD

## 2015-07-15 DIAGNOSIS — G8929 Other chronic pain: Secondary | ICD-10-CM | POA: Diagnosis not present

## 2015-07-15 DIAGNOSIS — Z79899 Other long term (current) drug therapy: Secondary | ICD-10-CM | POA: Diagnosis not present

## 2015-07-15 DIAGNOSIS — I10 Essential (primary) hypertension: Secondary | ICD-10-CM | POA: Diagnosis not present

## 2015-07-15 DIAGNOSIS — R079 Chest pain, unspecified: Secondary | ICD-10-CM | POA: Insufficient documentation

## 2015-07-15 DIAGNOSIS — H81391 Other peripheral vertigo, right ear: Secondary | ICD-10-CM | POA: Insufficient documentation

## 2015-07-15 LAB — BASIC METABOLIC PANEL
Anion gap: 12 (ref 5–15)
BUN: 7 mg/dL (ref 6–20)
CALCIUM: 10.4 mg/dL — AB (ref 8.9–10.3)
CHLORIDE: 97 mmol/L — AB (ref 101–111)
CO2: 28 mmol/L (ref 22–32)
CREATININE: 0.94 mg/dL (ref 0.44–1.00)
GFR calc Af Amer: >60 mL/min (ref >60)
GFR calc non Af Amer: >60 mL/min (ref >60)
Glucose, Bld: 99 mg/dL (ref 65–99)
Potassium: 3.8 mmol/L (ref 3.5–5.1)
SODIUM: 137 mmol/L (ref 135–145)

## 2015-07-15 LAB — CBC
HCT: 38.1 % (ref 36.0–46.0)
Hemoglobin: 11.1 g/dL — ABNORMAL LOW (ref 12.0–15.0)
MCH: 20.6 pg — ABNORMAL LOW (ref 26.0–34.0)
MCHC: 29.1 g/dL — ABNORMAL LOW (ref 30.0–36.0)
MCV: 70.7 fL — ABNORMAL LOW (ref 78.0–100.0)
PLATELETS: 307 10*3/uL (ref 150–400)
RBC: 5.39 MIL/uL — ABNORMAL HIGH (ref 3.87–5.11)
RDW: 15.6 % — AB (ref 11.5–15.5)
WBC: 6.9 10*3/uL (ref 4.0–10.5)

## 2015-07-15 LAB — I-STAT TROPONIN, ED: Troponin i, poc: 0 ng/mL (ref 0.00–0.08)

## 2015-07-15 NOTE — ED Notes (Signed)
Pt states she has been sob and dizzy since Sunday. Shortness of breath is worse on exertion. Pt states today while laying down she got a tightness in her chest and became nauseous and vomited 4 times.

## 2015-07-16 ENCOUNTER — Emergency Department (HOSPITAL_COMMUNITY)
Admission: EM | Admit: 2015-07-16 | Discharge: 2015-07-16 | Disposition: A | Discharge status: Home / Self Care | Payer: BLUE CROSS/BLUE SHIELD | Attending: Emergency Medicine | Admitting: Emergency Medicine

## 2015-07-16 DIAGNOSIS — G8929 Other chronic pain: Secondary | ICD-10-CM

## 2015-07-16 DIAGNOSIS — H81391 Other peripheral vertigo, right ear: Secondary | ICD-10-CM

## 2015-07-16 DIAGNOSIS — R079 Chest pain, unspecified: Secondary | ICD-10-CM

## 2015-07-16 LAB — I-STAT TROPONIN, ED: Troponin i, poc: 0.01 ng/mL (ref 0.00–0.08)

## 2015-07-16 MED ORDER — KETOROLAC TROMETHAMINE 30 MG/ML IJ SOLN
30.0000 mg | Freq: Once | INTRAMUSCULAR | Status: AC
  Administered 2015-07-16: 30 mg via INTRAVENOUS
  Filled 2015-07-16: qty 1

## 2015-07-16 MED ORDER — SODIUM CHLORIDE 0.9 % IV BOLUS (SEPSIS)
1000.0000 mL | Freq: Once | INTRAVENOUS | Status: AC
  Administered 2015-07-16: 1000 mL via INTRAVENOUS

## 2015-07-16 MED ORDER — ONDANSETRON HCL 4 MG/2ML IJ SOLN
4.0000 mg | Freq: Once | INTRAMUSCULAR | Status: AC
  Administered 2015-07-16: 4 mg via INTRAVENOUS
  Filled 2015-07-16: qty 2

## 2015-07-16 MED ORDER — MECLIZINE HCL 25 MG PO TABS
50.0000 mg | ORAL_TABLET | Freq: Once | ORAL | Status: AC
  Administered 2015-07-16: 50 mg via ORAL
  Filled 2015-07-16: qty 2

## 2015-07-16 MED ORDER — ONDANSETRON 4 MG PO TBDP: 4.0000 mg | ORAL_TABLET | Freq: Three times a day (TID) | ORAL | Status: DC | PRN

## 2015-07-16 MED ORDER — DIAZEPAM 5 MG PO TABS: 5.0000 mg | ORAL_TABLET | Freq: Three times a day (TID) | ORAL | Status: DC | PRN

## 2015-07-16 MED ORDER — DIAZEPAM 5 MG/ML IJ SOLN
2.5000 mg | Freq: Once | INTRAMUSCULAR | Status: AC
  Administered 2015-07-16: 2.5 mg via INTRAVENOUS
  Filled 2015-07-16: qty 2

## 2015-07-16 NOTE — Discharge Instructions (Signed)
Benign Positional Vertigo °Vertigo is the feeling that you or your surroundings are moving when they are not. Benign positional vertigo is the most common form of vertigo. The cause of this condition is not serious (is benign). This condition is triggered by certain movements and positions (is positional). This condition can be dangerous if it occurs while you are doing something that could endanger you or others, such as driving.  °CAUSES °In many cases, the cause of this condition is not known. It may be caused by a disturbance in an area of the inner ear that helps your brain to sense movement and balance. This disturbance can be caused by a viral infection (labyrinthitis), head injury, or repetitive motion. °RISK FACTORS °This condition is more likely to develop in: °· Women. °· People who are 50 years of age or older. °SYMPTOMS °Symptoms of this condition usually happen when you move your head or your eyes in different directions. Symptoms may start suddenly, and they usually last for less than a minute. Symptoms may include: °· Loss of balance and falling. °· Feeling like you are spinning or moving. °· Feeling like your surroundings are spinning or moving. °· Nausea and vomiting. °· Blurred vision. °· Dizziness. °· Involuntary eye movement (nystagmus). °Symptoms can be mild and cause only slight annoyance, or they can be severe and interfere with daily life. Episodes of benign positional vertigo may return (recur) over time, and they may be triggered by certain movements. Symptoms may improve over time. °DIAGNOSIS °This condition is usually diagnosed by medical history and a physical exam of the head, neck, and ears. You may be referred to a health care provider who specializes in ear, nose, and throat (ENT) problems (otolaryngologist) or a provider who specializes in disorders of the nervous system (neurologist). You may have additional testing, including: °· MRI. °· A CT scan. °· Eye movement tests. Your  health care provider may ask you to change positions quickly while he or she watches you for symptoms of benign positional vertigo, such as nystagmus. Eye movement may be tested with an electronystagmogram (ENG), caloric stimulation, the Dix-Hallpike test, or the roll test. °· An electroencephalogram (EEG). This records electrical activity in your brain. °· Hearing tests. °TREATMENT °Usually, your health care provider will treat this by moving your head in specific positions to adjust your inner ear back to normal. Surgery may be needed in severe cases, but this is rare. In some cases, benign positional vertigo may resolve on its own in 2-4 weeks. °HOME CARE INSTRUCTIONS °Safety °· Move slowly. Avoid sudden body or head movements. °· Avoid driving. °· Avoid operating heavy machinery. °· Avoid doing any tasks that would be dangerous to you or others if a vertigo episode would occur. °· If you have trouble walking or keeping your balance, try using a cane for stability. If you feel dizzy or unstable, sit down right away. °· Return to your normal activities as told by your health care provider. Ask your health care provider what activities are safe for you. °General Instructions °· Take over-the-counter and prescription medicines only as told by your health care provider. °· Avoid certain positions or movements as told by your health care provider. °· Drink enough fluid to keep your urine clear or pale yellow. °· Keep all follow-up visits as told by your health care provider. This is important. °SEEK MEDICAL CARE IF: °· You have a fever. °· Your condition gets worse or you develop new symptoms. °· Your family or friends   notice any behavioral changes.  Your nausea or vomiting gets worse.  You have numbness or a "pins and needles" sensation. SEEK IMMEDIATE MEDICAL CARE IF:  You have difficulty speaking or moving.  You are always dizzy.  You faint.  You develop severe headaches.  You have weakness in your  legs or arms.  You have changes in your hearing or vision.  You develop a stiff neck.  You develop sensitivity to light.   This information is not intended to replace advice given to you by your health care provider. Make sure you discuss any questions you have with your health care provider.   Document Released: 03/28/2006 Document Revised: 03/11/2015 Document Reviewed: 10/13/2014 Elsevier Interactive Patient Education 2016 [employer].  [employer] [facility] WHAT IS THE EPLEY MANEUVER? The Epley maneuver is an exercise you can do to relieve symptoms of benign paroxysmal positional vertigo (BPPV). This condition is often just referred to as vertigo. BPPV is caused by the movement of tiny crystals (canaliths) inside your inner ear. The accumulation and movement of canaliths in your inner ear causes a sudden spinning sensation (vertigo) when you move your head to certain positions. Vertigo usually lasts about 30 seconds. BPPV usually occurs in just one ear. If you get vertigo when you lie on your left side, you probably have BPPV in your left ear. Your health care provider can tell you which ear is involved.  BPPV may be caused by a head injury. Many people older than 50 get BPPV for unknown reasons. If you have been diagnosed with BPPV, your health care provider may teach you how to do this maneuver. BPPV is not life threatening (benign) and usually goes away in time.  WHEN SHOULD I PERFORM THE EPLEY MANEUVER? You can do this maneuver at home whenever you have symptoms of vertigo. You may do the Epley maneuver up to 3 times a day until your symptoms of vertigo go away. HOW SHOULD I DO THE EPLEY MANEUVER?  Sit on the edge of a bed or table with your back straight. Your legs should be extended or hanging over the edge of the bed or table.   Turn your head halfway toward the affected ear.   Lie backward quickly with your head turned until you are lying flat on your back. You may want to  position a pillow under your shoulders.   Hold this position for 30 seconds. You may experience an attack of vertigo. This is normal. Hold this position until the vertigo stops.  Then turn your head to the opposite direction until your unaffected ear is facing the floor.   Hold this position for 30 seconds. You may experience an attack of vertigo. This is normal. Hold this position until the vertigo stops.  Now turn your whole body to the same side as your head. Hold for another 30 seconds.   You can then sit back up. ARE THERE RISKS TO THIS MANEUVER? In some cases, you may have other symptoms (such as changes in your vision, weakness, or numbness). If you have these symptoms, stop doing the maneuver and call your health care provider. Even if doing these maneuvers relieves your vertigo, you may still have dizziness. Dizziness is the sensation of light-headedness but without the sensation of movement. Even though the Epley maneuver may relieve your vertigo, it is possible that your symptoms will return within 5 years. WHAT SHOULD I DO AFTER THIS MANEUVER? After doing the Epley maneuver, you can return to your  normal activities. Ask your doctor if there is anything you should do at home to prevent vertigo. This may include:  Sleeping with two or more pillows to keep your head elevated.  Not sleeping on the side of your affected ear.  Getting up slowly from bed.  Avoiding sudden movements during the day.  Avoiding extreme head movement, like looking up or bending over.  Wearing a cervical collar to prevent sudden head movements. WHAT SHOULD I DO IF MY SYMPTOMS GET WORSE? Call your health care provider if your vertigo gets worse. Call your provider right way if you have other symptoms, including:   Nausea.  Vomiting.  Headache.  Weakness.  Numbness.  Vision changes.   This information is not intended to replace advice given to you by your health care provider. Make sure you  discuss any questions you have with your health care provider.   Document Released: 06/25/2013 Document Reviewed: 06/25/2013 Elsevier Interactive Patient Education 2016 [employer] Inc[employer]  Nonspecific Chest Pain  Chest pain can be caused by many different conditions. There is always a chance that your pain could be related to something serious, such as a heart attack or a blood clot in your lungs. Chest pain can also be caused by conditions that are not life-threatening. If you have chest pain, it is very important to follow up with your health care provider. CAUSES  Chest pain can be caused by:  Heartburn.  Pneumonia or bronchitis.  Anxiety or stress.  Inflammation around your heart (pericarditis) or lung (pleuritis or pleurisy).  A blood clot in your lung.  A collapsed lung (pneumothorax). It can develop suddenly on its own (spontaneous pneumothorax) or from trauma to the chest.  Shingles infection (varicella-zoster virus).  Heart attack.  Damage to the bones, muscles, and cartilage that make up your chest wall. This can include:  Bruised bones due to injury.  Strained muscles or cartilage due to frequent or repeated coughing or overwork.  Fracture to one or more ribs.  Sore cartilage due to inflammation (costochondritis). RISK FACTORS  Risk factors for chest pain may include:  Activities that increase your risk for trauma or injury to your chest.  Respiratory infections or conditions that cause frequent coughing.  Medical conditions or overeating that can cause heartburn.  Heart disease or family history of heart disease.  Conditions or health behaviors that increase your risk of developing a blood clot.  Having had chicken pox (varicella zoster). SIGNS AND SYMPTOMS Chest pain can feel like:  Burning or tingling on the surface of your chest or deep in your chest.  Crushing, pressure, aching, or squeezing pain.  Dull or sharp pain that is worse when you move,  cough, or take a deep breath.  Pain that is also felt in your back, neck, shoulder, or arm, or pain that spreads to any of these areas. Your chest pain may come and go, or it may stay constant. DIAGNOSIS Lab tests or other studies may be needed to find the cause of your pain. Your health care provider may have you take a test called an ambulatory ECG (electrocardiogram). An ECG records your heartbeat patterns at the time the test is performed. You may also have other tests, such as:  Transthoracic echocardiogram (TTE). During echocardiography, sound waves are used to create a picture of all of the heart structures and to look at how blood flows through your heart.  Transesophageal echocardiogram (TEE).This is a more advanced imaging test that obtains images from inside  your body. It allows your health care provider to see your heart in finer detail.  Cardiac monitoring. This allows your health care provider to monitor your heart rate and rhythm in real time.  Holter monitor. This is a portable device that records your heartbeat and can help to diagnose abnormal heartbeats. It allows your health care provider to track your heart activity for several days, if needed.  Stress tests. These can be done through exercise or by taking medicine that makes your heart beat more quickly.  Blood tests.  Imaging tests. TREATMENT  Your treatment depends on what is causing your chest pain. Treatment may include:  Medicines. These may include:  Acid blockers for heartburn.  Anti-inflammatory medicine.  Pain medicine for inflammatory conditions.  Antibiotic medicine, if an infection is present.  Medicines to dissolve blood clots.  Medicines to treat coronary artery disease.  Supportive care for conditions that do not require medicines. This may include:  Resting.  Applying heat or cold packs to injured areas.  Limiting activities until pain decreases. HOME CARE INSTRUCTIONS  If you were  prescribed an antibiotic medicine, finish it all even if you start to feel better.  Avoid any activities that bring on chest pain.  Do not use any tobacco products, including cigarettes, chewing tobacco, or electronic cigarettes. If you need help quitting, ask your health care provider.  Do not drink alcohol.  Take medicines only as directed by your health care provider.  Keep all follow-up visits as directed by your health care provider. This is important. This includes any further testing if your chest pain does not go away.  If heartburn is the cause for your chest pain, you may be told to keep your head raised (elevated) while sleeping. This reduces the chance that acid will go from your stomach into your esophagus.  Make lifestyle changes as directed by your health care provider. These may include:  Getting regular exercise. Ask your health care provider to suggest some activities that are safe for you.  Eating a heart-healthy diet. A registered dietitian can help you to learn healthy eating options.  Maintaining a healthy weight.  Managing diabetes, if necessary.  Reducing stress. SEEK MEDICAL CARE IF:  Your chest pain does not go away after treatment.  You have a rash with blisters on your chest.  You have a fever. SEEK IMMEDIATE MEDICAL CARE IF:   Your chest pain is worse.  You have an increasing cough, or you cough up blood.  You have severe abdominal pain.  You have severe weakness.  You faint.  You have chills.  You have sudden, unexplained chest discomfort.  You have sudden, unexplained discomfort in your arms, back, neck, or jaw.  You have shortness of breath at any time.  You suddenly start to sweat, or your skin gets clammy.  You feel nauseous or you vomit.  You suddenly feel light-headed or dizzy.  Your heart begins to beat quickly, or it feels like it is skipping beats. These symptoms may represent a serious problem that is an emergency. Do  not wait to see if the symptoms will go away. Get medical help right away. Call your local emergency services (911 in the [place] Do not drive yourself to the hospital.   This information is not intended to replace advice given to you by your health care provider. Make sure you discuss any questions you have with your health care provider.   Document Released: 03/30/2005 Document Revised: 07/11/2014 Document Reviewed:  01/24/2014 Elsevier Interactive Patient Education [employer]2016 [employer] Inc[employer]

## 2015-07-16 NOTE — ED Notes (Signed)
Patient states she is feeling better.

## 2015-07-16 NOTE — ED Provider Notes (Signed)
TIME SEEN:  By signing my name below, I, Arianna Nassar, attest that this documentation has been prepared under the direction and in the presence of Enbridge EnergyKristen N Auria Mckinlay, DO. Electronically Signed: Octavia HeirArianna Nassar, ED Scribe. 07/16/2015. 1:12 AM.   CHIEF COMPLAINT: Dizziness/Chest Pain/Weakness  HPI:  HPI Comments: Paula Nelson is a 45 y.o. female with hypertension who presents to the Emergency Department complaining of intermittent, gradual worsening, moderate dizziness with associated chronic left sided chest pain, nausea, and shortness of breath onset 5 days ago. Pt has had similar pain in the past and she reports this current episodes has lasted about 2 hours.  Pt states her chest pain and shortness of breath is worse upon exacerbation but alleviates minimally when she is still and not moving.  Pt has not taken any medication to alleviate her pain but notes laying down alleviates her symptoms minimally.  She has an appointment with her cardiologist, Dr. Antoine PocheHochrein, in 6 months. Pt denies hx of DM, HLD, tobacco use, family hx of MI at young age.  Last stress test was November per her report and normal.  No h/o PE or DVT.  No h/o catheritization. She reports she has chest pain currently but no shortness of breath.   She states that the main reason she is in the emergency department is because she notes that she gets extremely dizzy when she turns her head to the right or stands up. She describes the dizziness as a feeling that the room is spinning. This is been present for the past several days. She denies any  ringing in ears, ear pain. No recent head injury. No headache. Not on anticoagulation. No numbness or focal weakness. Has never had vertigo before.  ROS: See HPI Constitutional: no fever  Eyes: no drainage  ENT: no runny nose   Cardiovascular: chest pain  Resp: no SOB  GI: no vomiting GU: no dysuria Integumentary: no rash  Allergy: no hives  Musculoskeletal: no leg swelling  Neurological: no  slurred speech ROS otherwise negative  PAST MEDICAL HISTORY/PAST SURGICAL HISTORY:  Past Medical History  Diagnosis Date  . Hypertension   . Palpitations     MEDICATIONS:  Prior to Admission medications   Medication Sig Start Date End Date Taking? Authorizing Provider  benazepril (LOTENSIN) 20 MG tablet Take 1 tablet (20 mg total) by mouth 2 (two) times daily. 04/13/15   Brittainy Sherlynn CarbonM Simmons, PA-C  chlorthalidone (HYGROTON) 25 MG tablet Take 1 tablet (25 mg total) by mouth daily. 05/04/15   Rollene RotundaJames Hochrein, MD  ferrous sulfate 324 (65 FE) MG TBEC Take 1 tablet (324 mg total) by mouth daily. 02/21/14   Brittainy Sherlynn CarbonM Simmons, PA-C  ibuprofen (ADVIL,MOTRIN) 600 MG tablet Take 1 tablet (600 mg total) by mouth every 6 (six) hours as needed for pain. 08/05/12   Loren Raceravid Yelverton, MD  lansoprazole (PREVACID SOLUTAB) 15 MG disintegrating tablet Take 1 tablet (15 mg total) by mouth daily. 01/14/15   John Molpus, MD  ondansetron (ZOFRAN-ODT) 4 MG disintegrating tablet Take 1 tablet (4 mg total) by mouth every 8 (eight) hours as needed for nausea or vomiting. 01/17/15   Tomasita CrumbleAdeleke Oni, MD  potassium chloride SA (KLOR-CON M20) 20 MEQ tablet Take 1 tablet (20 mEq total) by mouth daily. 05/22/15   Rollene RotundaJames Hochrein, MD    ALLERGIES:  No Known Allergies  SOCIAL HISTORY:  Social History  Substance Use Topics  . Smoking status: Never Smoker   . Smokeless tobacco: Not on file  . Alcohol Use:  Yes    FAMILY HISTORY: Family History  Problem Relation Age of Onset  . Atrial fibrillation Father   . Hypertension Father   . Hypertension Mother     EXAM: Triage vitals: BP 157/107 mmHg  Pulse 106  Temp(Src) 98.6 F (37 C) (Oral)  Resp 16  Ht 4\' 11"  (1.499 m)  Wt 170 lb (77.111 kg)  BMI 34.32 kg/m2  SpO2 99%  LMP 06/21/2015 CONSTITUTIONAL: Alert and oriented and responds appropriately to questions. Well-appearing; well-nourished HEAD: Normocephalic EYES: Conjunctivae clear, PERRL ENT: normal nose; no  rhinorrhea; moist mucous membranes; pharynx without lesions noted, TM's clear bilaterally without erythema, bulging, perforation, or cerumen impaction. NECK: Supple, no meningismus, no LAD  CARD: RRR; S1 and S2 appreciated; no murmurs, no clicks, no rubs, no gallops CHEST:  Chest wall is nontender to palpation without crepitus, ecchymosis or deformity RESP: Normal chest excursion without splinting or tachypnea; breath sounds clear and equal bilaterally; no wheezes, no rhonchi, no rales, no hypoxia or respiratory distress, speaking full sentences ABD/GI: Normal bowel sounds; non-distended; soft, non-tender, no rebound, no guarding, no peritoneal signs BACK:  The back appears normal and is non-tender to palpation, there is no CVA tenderness EXT: Normal ROM in all joints; non-tender to palpation; no edema; normal capillary refill; no cyanosis, no calf tenderness or swelling    SKIN: Normal color for age and race; warm NEURO: Moves all extremities equally, sensation to light touch intact diffusely, cranial nerves II through XII intact, strength 5/5 in all four extremities, fatigable horizontal nystagmus when looking to the left PSYCH: The patient's mood and manner are appropriate. Grooming and personal hygiene are appropriate.  MEDICAL DECISION MAKING: Here with complaints of chronic chest pain. Has had a recent negative stress test. Is hypertensive but this appears to be chronic for patient. She is on benazepril, chlorthalidone.  EKG shows nonspecific T-wave flattening compared to prior EKG in 2015. Her troponin is negative. Chest x-ray is clear. Her heart score is 3.  No risk factors for pulmonary embolus. Doubt dissection given the symptoms have been chronic for several months. Will repeat second troponin but I feel she can follow up with her cardiologist as an outpatient. Will give Toradol for her pain and reassess.   It appears that the major reason the patient came to the emergency department is for  vertigo which is new. Is worse with standing upright and turning her head to the right. Suspect this is peripheral in nature and not a central source of vertigo. We'll treat with IV fluids, meclizine and Zofran and reassess. I do not feel she needs head imaging at this time.   ED PROGRESS: 2:50 AM  Pt's chest pain is now gone after Toradol. Her blood pressure has also improved. States that her dizziness has not completely resolved. She is able to stand and is not vomiting. Will give dose of IV Valium and reassess.  4:30 AM  Pt now reports her vertigo is on is completely gone after Valium. I feel she is safe to be discharged home. We'll discharge with Zofran, Valium. Have asked her to follow-up with her cardiologist. Discussed return precautions. Patient and family at bedside verbalize understanding and are comfortable with this plan.      EKG Interpretation  Date/Time:  Thursday July 16 2015 00:29:12 EST Ventricular Rate:  95 PR Interval:  135 QRS Duration: 84 QT Interval:  366 QTC Calculation: 460 R Axis:   50 Text Interpretation:  Sinus rhythm Borderline T abnormalities, diffuse  leads New t wave changes compared to  EKG in 2015 Confirmed by Leigh Kaeding,  DO, Rosco Harriott [phone]) on 07/16/2015 12:34:07 AM          I personally performed the services described in this documentation, which was scribed in my presence. The recorded information has been reviewed and is accurate.   Layla Maw Briah Nary, DO 07/16/15 [phone]

## 2016-07-30 IMAGING — CT CT MAXILLOFACIAL W/O CM
1 series · 15 of 30 positions shown, 19 images · non-contrast
Comparison: None.

CLINICAL DATA: 43-year-old female with left-sided facial numbness
and tingling.

EXAM:
CT MAXILLOFACIAL WITHOUT CONTRAST
TECHNIQUE: Multidetector CT imaging of the maxillofacial structures was
performed. Multiplanar CT image reconstructions were also generated.
A small metallic BB was placed on the right temple in order to
reliably differentiate right from left.

[Series 3: maxillofacial 2.0 h30s st · axial · 0.38mm/px · z∈[-248,-110]mm · 15 of 75 slices shown, 19 images]
[im 3/75  brain]
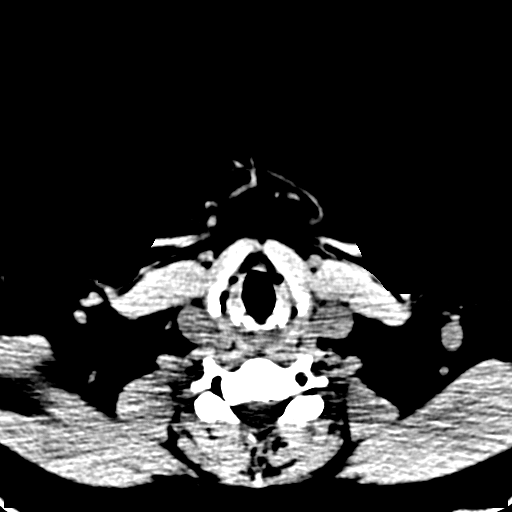
[im 3/75  bone]
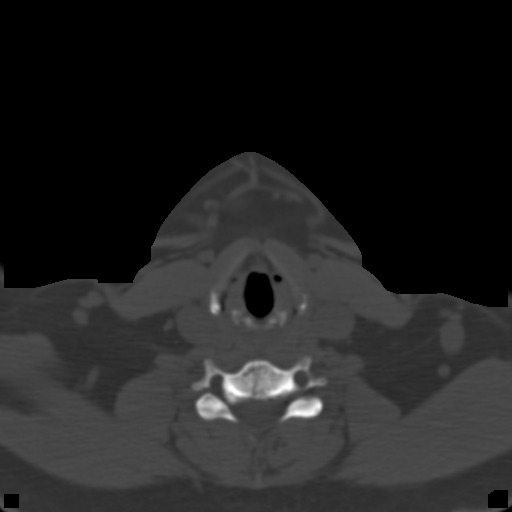
[im 8/75  bone]
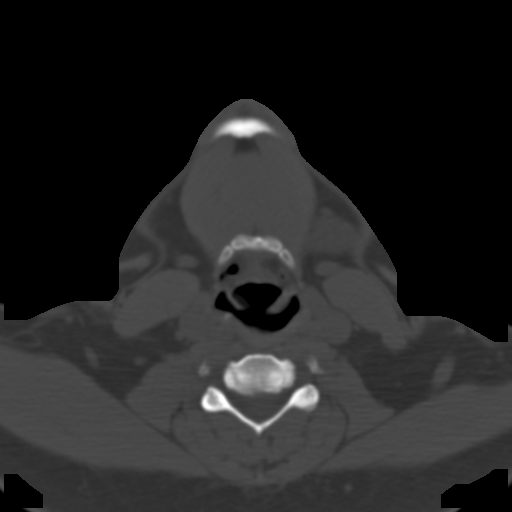
[im 13/75  bone]
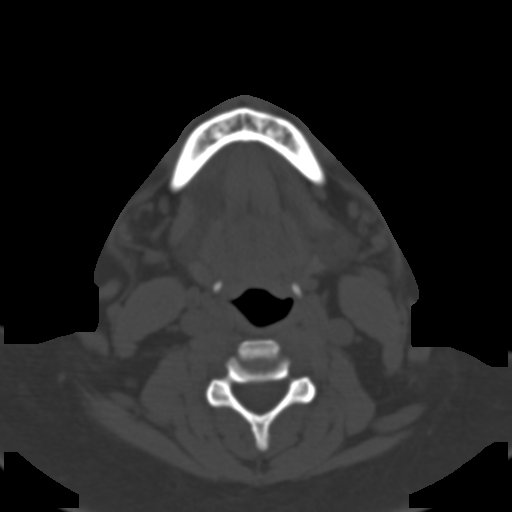
[im 18/75  bone]
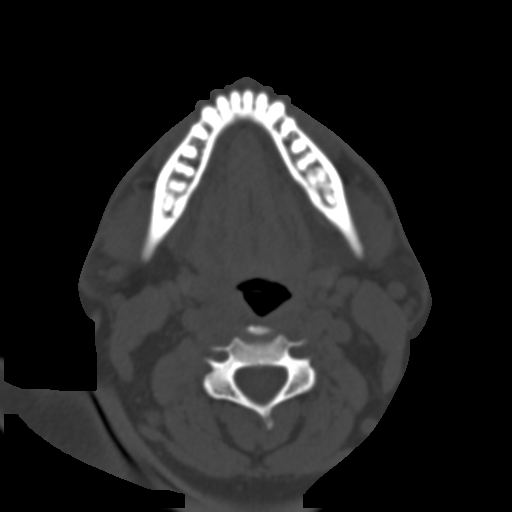
[im 23/75  brain]
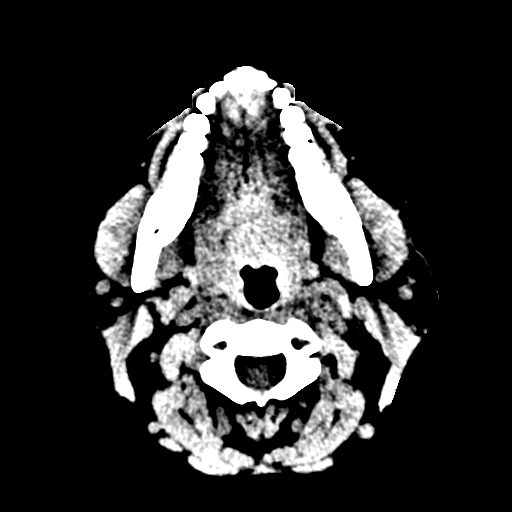
[im 23/75  bone]
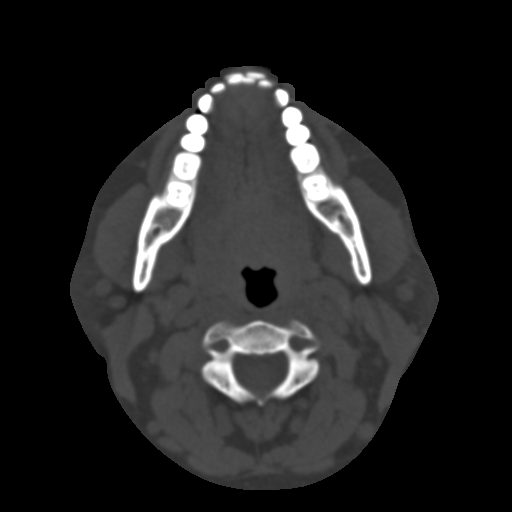
[im 29/75  bone]
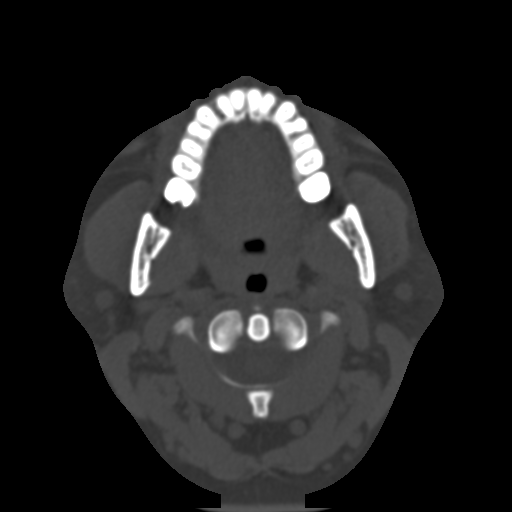
[im 34/75  bone]
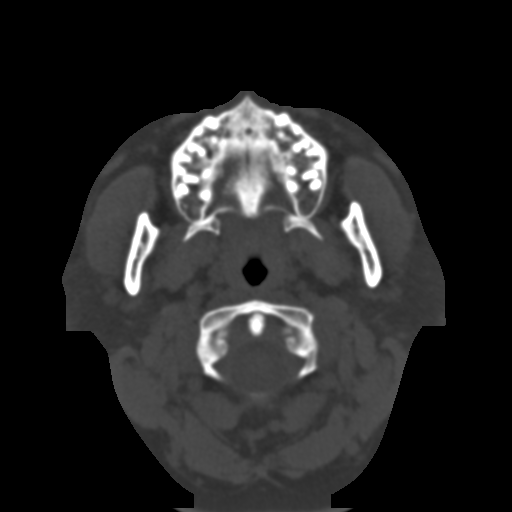
[im 39/75  bone]
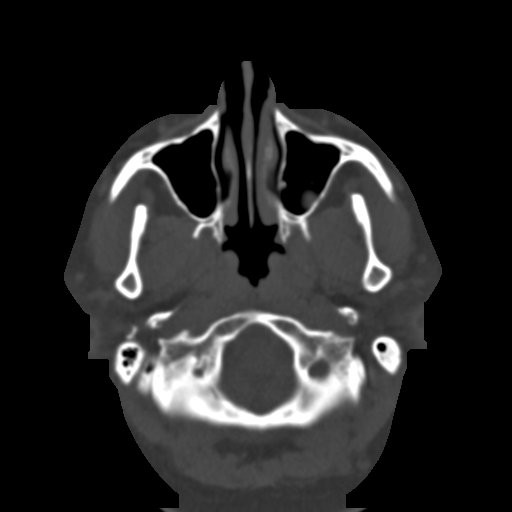
[im 41/75  brain]
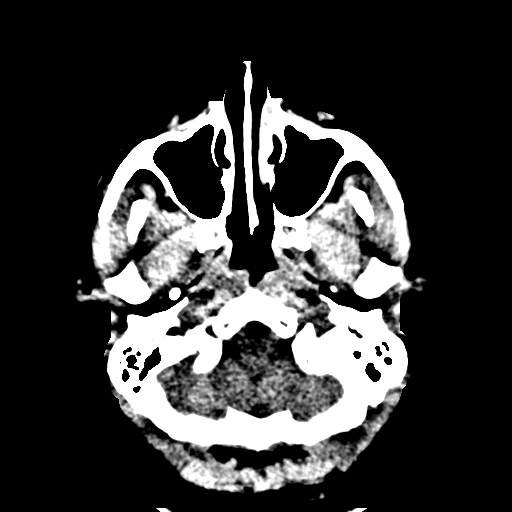
[im 41/75  bone]
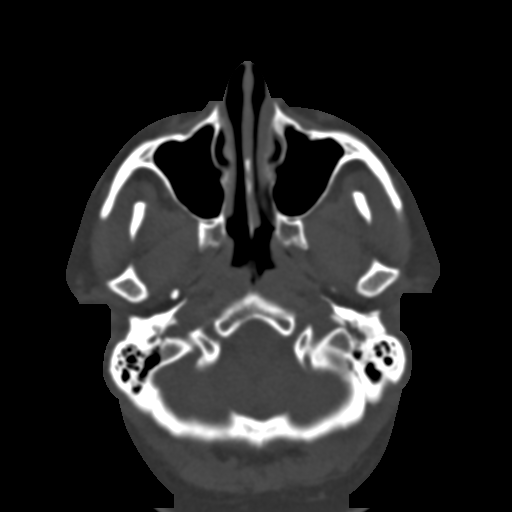
[im 46/75  bone]
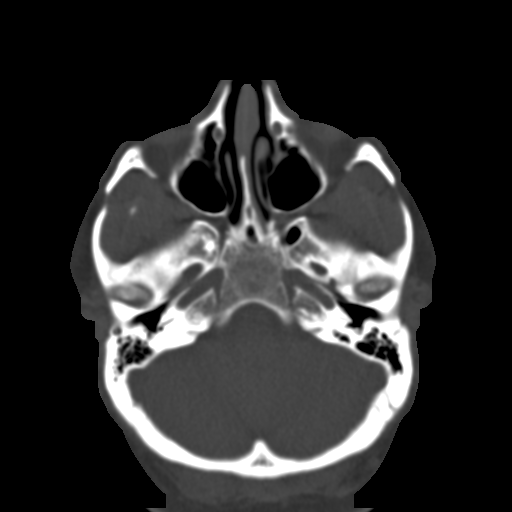
[im 52/75  bone]
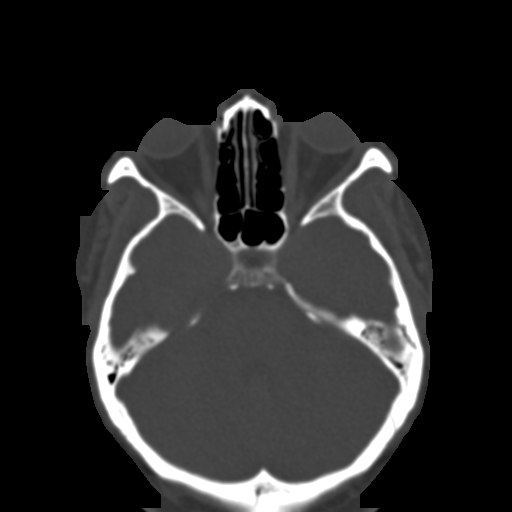
[im 57/75  bone]
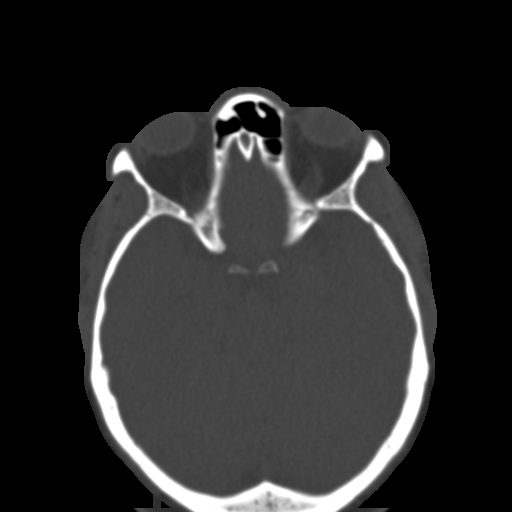
[im 62/75  brain]
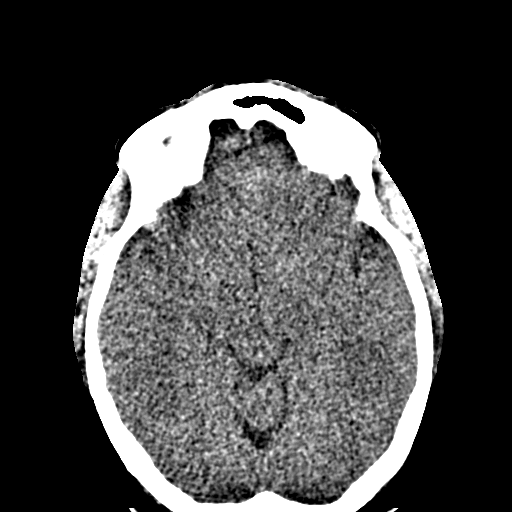
[im 62/75  bone]
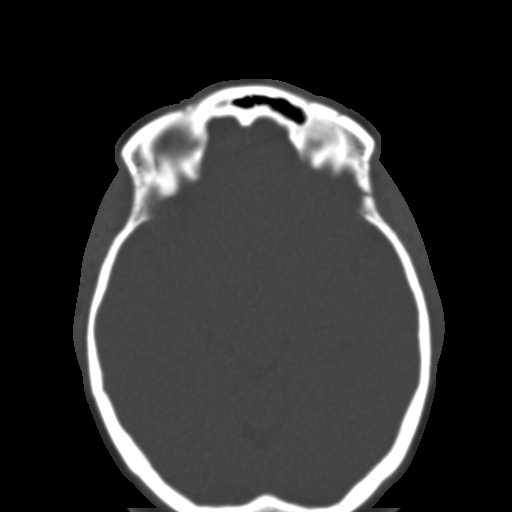
[im 67/75  bone]
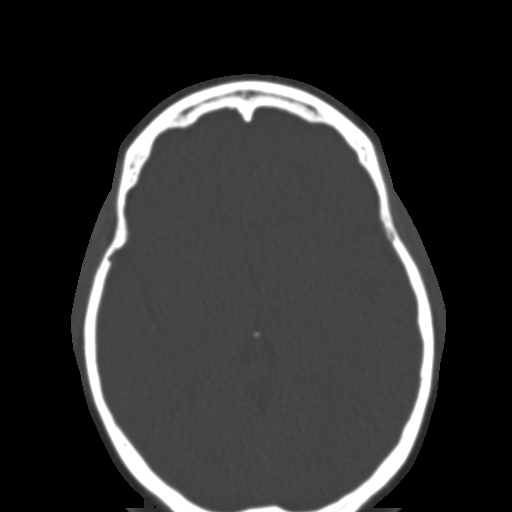
[im 72/75  bone]
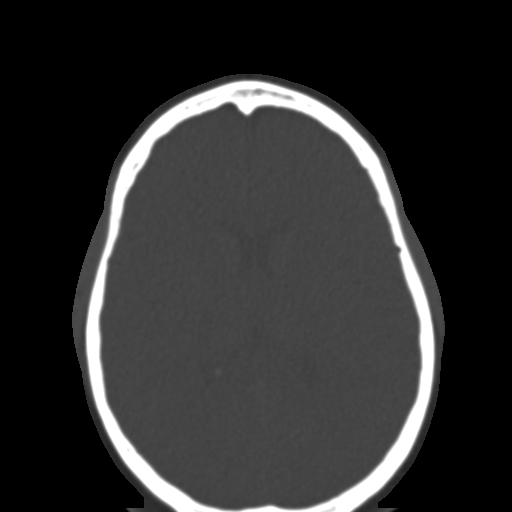

[15 of 30 positions shown; findings below may reference images not displayed]

FINDINGS: Evaluation of this exam is limited in the absence of intravenous
contrast.

There is no acute fracture of the facial bone. There is mild
mucoperiosteal thickening of the left maxillary sinus. The remainder
of the visualized paranasal sinuses and mastoid air cells are well
aerated. The globes and retro-orbital fat are preserved. The
visualized brain appears unremarkable. No lymphadenopathy
identified.

The right submandibular gland is not well visualized. The There is
small amount of fluid tracking along the fascia in the floor of the
mouth along the course of the submandibular gland ducts bilaterally.
No definite calculus identified. The is no definite inflammatory
changes of the left submandibular gland. Ultrasound may provide
better evaluation of the salivary glands and duct for small or non
radiopaque calculi.
IMPRESSION: Small amount of fluid along the course of the submandibular gland
ducts at the floor of the mouth. No definite calculus identified.
Sialadenitis is not excluded. Correlation with clinical exam is
recommended. Ultrasound may provide better evaluation of the
salivary glands and the ducts.

## 2016-08-07 ENCOUNTER — Emergency Department (HOSPITAL_COMMUNITY)
Admission: EM | Admit: 2016-08-07 | Discharge: 2016-08-07 | Disposition: A | Discharge status: Home / Self Care | Payer: BLUE CROSS/BLUE SHIELD | Attending: Emergency Medicine | Admitting: Emergency Medicine

## 2016-08-07 ENCOUNTER — Encounter (HOSPITAL_COMMUNITY): Payer: Self-pay

## 2016-08-07 ENCOUNTER — Emergency Department (HOSPITAL_COMMUNITY): Payer: BLUE CROSS/BLUE SHIELD

## 2016-08-07 DIAGNOSIS — R079 Chest pain, unspecified: Secondary | ICD-10-CM | POA: Diagnosis present

## 2016-08-07 DIAGNOSIS — M545 Low back pain, unspecified: Secondary | ICD-10-CM

## 2016-08-07 DIAGNOSIS — D509 Iron deficiency anemia, unspecified: Secondary | ICD-10-CM

## 2016-08-07 DIAGNOSIS — D649 Anemia, unspecified: Secondary | ICD-10-CM | POA: Diagnosis not present

## 2016-08-07 DIAGNOSIS — K2100 Gastro-esophageal reflux disease with esophagitis, without bleeding: Secondary | ICD-10-CM

## 2016-08-07 DIAGNOSIS — I10 Essential (primary) hypertension: Secondary | ICD-10-CM | POA: Insufficient documentation

## 2016-08-07 LAB — BASIC METABOLIC PANEL
ANION GAP: 9 (ref 5–15)
BUN: 12 mg/dL (ref 6–20)
CALCIUM: 9.6 mg/dL (ref 8.9–10.3)
CO2: 23 mmol/L (ref 22–32)
Chloride: 105 mmol/L (ref 101–111)
Creatinine, Ser: 0.87 mg/dL (ref 0.44–1.00)
GFR calc Af Amer: >60 mL/min (ref >60)
GLUCOSE: 88 mg/dL (ref 65–99)
Potassium: 3.8 mmol/L (ref 3.5–5.1)
SODIUM: 137 mmol/L (ref 135–145)

## 2016-08-07 LAB — CBC
HCT: 30.9 % — ABNORMAL LOW (ref 36.0–46.0)
HEMOGLOBIN: 8.8 g/dL — AB (ref 12.0–15.0)
MCH: 18.9 pg — ABNORMAL LOW (ref 26.0–34.0)
MCHC: 28.5 g/dL — AB (ref 30.0–36.0)
MCV: 66.3 fL — ABNORMAL LOW (ref 78.0–100.0)
Platelets: 273 10*3/uL (ref 150–400)
RBC: 4.66 MIL/uL (ref 3.87–5.11)
RDW: 17.5 % — ABNORMAL HIGH (ref 11.5–15.5)
WBC: 7.4 10*3/uL (ref 4.0–10.5)

## 2016-08-07 LAB — I-STAT TROPONIN, ED: TROPONIN I, POC: 0 ng/mL (ref 0.00–0.08)

## 2016-08-07 MED ORDER — ALUM & MAG HYDROXIDE-SIMETH 200-200-20 MG/5ML PO SUSP
30.0000 mL | Freq: Once | ORAL | Status: AC
  Administered 2016-08-07: 30 mL via ORAL
  Filled 2016-08-07: qty 30

## 2016-08-07 MED ORDER — FAMOTIDINE 20 MG PO TABS: 20.0000 mg | ORAL_TABLET | Freq: Two times a day (BID) | ORAL | 0 refills | Status: AC

## 2016-08-07 MED ORDER — CYCLOBENZAPRINE HCL 10 MG PO TABS: 10.0000 mg | ORAL_TABLET | Freq: Two times a day (BID) | ORAL | 0 refills | Status: DC | PRN

## 2016-08-07 MED ORDER — LIDOCAINE VISCOUS 2 % MT SOLN
15.0000 mL | Freq: Once | OROMUCOSAL | Status: AC
  Administered 2016-08-07: 15 mL via OROMUCOSAL
  Filled 2016-08-07: qty 15

## 2016-08-07 MED ORDER — FERROUS SULFATE 325 (65 FE) MG PO TABS: 325.0000 mg | ORAL_TABLET | Freq: Every day | ORAL | 0 refills | Status: DC

## 2016-08-07 NOTE — ED Provider Notes (Signed)
The patient is a 46 year old female, presents with bilateral lower back pain for the last several weeks, seems to get worse when she moves, better when she sits still, better at night when she sleeps and not associated with any numbness or weakness of the legs or urinary incontinence or retention, no fever, no history of cancer, no surgery. She denies fevers or chills. She also has a sense of burning sensation from her epigastrium up into her chest. On exam the patient has minimal tenderness to palpation of the epigastric left upper quadrant, there is no other abdominal tenderness. Heart and lung exams are unremarkable with no murmurs rubs or gallops, no wheezing rhonchi or rales, there is no lower extremity edema, strength and sensation are normal the lower extremities. She does have some reproducible tenderness across the lower back.  The patient appears stable for discharge, she suspects she has some benign lower back pain, chest discomfort is nonspecific, testing has been unremarkable, we'll encourage antacid use along with Flexeril for the back discomfort.   EKG Interpretation  Date/Time:  Sunday August 07 2016 19:18:11 EST Ventricular Rate:  81 PR Interval:  130 QRS Duration: 82 QT Interval:  376 QTC Calculation: 436 R Axis:   58 Text Interpretation:  Normal sinus rhythm Nonspecific T wave abnormality Abnormal ECG since last tracing no significant change Confirmed by Hyacinth MeekerMILLER  MD, Donevan Biller ([id number]) on 08/07/2016 10:01:32 PM Also confirmed by Hyacinth MeekerMILLER  MD, Tam Delisle ([id number]), editor [place]  CCT, BEVERLY ([id number])  on 08/08/2016 8:12:36 AM      I saw and evaluated the patient, reviewed the resident's note and I agree with the findings and plan.   I personally interpreted the EKG as well as the resident and agree with the interpretation on the resident's chart.  Final diagnoses:  Acute bilateral low back pain without sciatica  Reflux esophagitis  Iron deficiency anemia, unspecified iron deficiency  anemia type      Eber HongBrian Aldwin Micalizzi, MD 08/17/16 1024

## 2016-08-07 NOTE — ED Notes (Signed)
Pt states chest pain unchanged.

## 2016-08-07 NOTE — ED Triage Notes (Signed)
Pt states that central CP started today, with nausea, no radiation or SOB. Pt also c/o of lower back pain that has been going on for three weeks.

## 2016-08-07 NOTE — ED Provider Notes (Signed)
[facility]-EMERGENCY DEPT Provider Note   CSN: [id number] Arrival date & time: 08/07/16  1907     History   Chief Complaint Chief Complaint  Patient presents with  . Chest Pain  . Back Pain    HPI Paula Nelson is a 46 y.o. female.  HPI 46 year old female with a history of hypertension and anemia presenting with 3 weeks of low back pain that is bilateral down into the thighs. Denies midline back pain. Denies shocking sensation down the legs. No recent falls or traumas. Denies bowel or bladder incontinence and saddle anesthesia. She states that she has had one day of a burning substernal sensation that does not radiate and is not worse with exertion. This is associated with mild nausea. She is a nonsmoker with no family history of heart disease. No alleviating or exacerbating factors chest pain. He states that her back pains worsens when she gets up and walks.  Past Medical History:  Diagnosis Date  . Hypertension   . Palpitations     Patient Active Problem List   Diagnosis Date Noted  . Iron deficiency anemia 02/25/2014  . Precordial pain 08/13/2012  . Palpitations 08/13/2012  . HTN (hypertension) 08/13/2012    Past Surgical History:  Procedure Laterality Date  . CHOLECYSTECTOMY    . TUBAL LIGATION      OB History    No data available       Home Medications    Prior to Admission medications   Medication Sig Start Date End Date Taking? Authorizing Provider  benazepril (LOTENSIN) 20 MG tablet Take 1 tablet (20 mg total) by mouth 2 (two) times daily. 04/13/15  Yes Brittainy Sherlynn CarbonM Simmons, PA-C  ibuprofen (ADVIL,MOTRIN) 200 MG tablet Take 600 mg by mouth every 6 (six) hours as needed (pain).   Yes Historical Provider, MD  chlorthalidone (HYGROTON) 25 MG tablet Take 1 tablet (25 mg total) by mouth daily. Patient not taking: Reported on 08/07/2016 05/04/15   Rollene RotundaJames Hochrein, MD  cyclobenzaprine (FLEXERIL) 10 MG tablet Take 1 tablet (10 mg total) by mouth 2 (two) times daily as  needed for muscle spasms. 08/07/16   Abdou Stocks [place]had Margrett Kalb, MD  diazepam (VALIUM) 5 MG tablet Take 1 tablet (5 mg total) by mouth every 8 (eight) hours as needed (vertigo). Patient not taking: Reported on 08/07/2016 07/16/15   Layla MawKristen N Ward, DO  famotidine (PEPCID) 20 MG tablet Take 1 tablet (20 mg total) by mouth 2 (two) times daily. 08/07/16   Leaha Cuervo [place] Kathrina Crosley, MD  ferrous sulfate 325 (65 FE) MG tablet Take 1 tablet (325 mg total) by mouth daily. 08/07/16   Kamalei Roeder [place]had Renzo Vincelette, MD  ibuprofen (ADVIL,MOTRIN) 600 MG tablet Take 1 tablet (600 mg total) by mouth every 6 (six) hours as needed for pain. Patient not taking: Reported on 08/07/2016 08/05/12   Loren Raceravid Yelverton, MD  ondansetron (ZOFRAN ODT) 4 MG disintegrating tablet Take 1 tablet (4 mg total) by mouth every 8 (eight) hours as needed for nausea or vomiting. Patient not taking: Reported on 08/07/2016 07/16/15   Layla MawKristen N Ward, DO  potassium chloride SA (KLOR-CON M20) 20 MEQ tablet Take 1 tablet (20 mEq total) by mouth daily. Patient not taking: Reported on 08/07/2016 05/22/15   Rollene RotundaJames Hochrein, MD    Family History Family History  Problem Relation Age of Onset  . Atrial fibrillation Father   . Hypertension Father   . Hypertension Mother     Social History Social History  Substance Use Topics  . Smoking status:  Never Smoker  . Smokeless tobacco: Never Used  . Alcohol use Yes     Allergies   Patient has no known allergies.   Review of Systems Review of Systems  Constitutional: Negative for chills and fever.  HENT: Negative for ear pain and sore throat.   Eyes: Negative for pain and visual disturbance.  Respiratory: Negative for cough and shortness of breath.   Cardiovascular: Positive for chest pain. Negative for palpitations.  Gastrointestinal: Positive for nausea. Negative for abdominal pain, blood in stool, diarrhea and vomiting.  Genitourinary: Negative for dysuria, hematuria and vaginal bleeding.  Musculoskeletal: Positive for back pain.  Negative for arthralgias, gait problem and neck pain.  Skin: Negative for color change and rash.  Neurological: Negative for dizziness, seizures, syncope, weakness and numbness.  All other systems reviewed and are negative.    Physical Exam Updated Vital Signs BP 161/89   Pulse 86   Temp 99 F (37.2 C)   Resp 18   Ht 4\' 11"  (1.499 m)   Wt 78.5 kg   LMP 07/15/2016   SpO2 100%   BMI 34.94 kg/m   Physical Exam  Constitutional: She is oriented to person, place, and time. She appears well-developed and well-nourished. No distress.  HENT:  Head: Normocephalic and atraumatic.  Eyes: Conjunctivae are normal.  Neck: Normal range of motion. Neck supple.  Cardiovascular: Normal rate, regular rhythm, S1 normal, S2 normal, normal heart sounds, intact distal pulses and normal pulses.   No murmur heard. Pulmonary/Chest: Effort normal and breath sounds normal. No respiratory distress. She has no decreased breath sounds. She has no rhonchi.  Abdominal: Soft. She exhibits no distension. There is no tenderness.  Musculoskeletal: She exhibits no edema.       Cervical back: She exhibits normal range of motion, no tenderness, no deformity and no pain.       Thoracic back: She exhibits normal range of motion, no tenderness, no deformity and no pain.       Back:  Neurological: She is alert and oriented to person, place, and time. She has normal strength and normal reflexes. No cranial nerve deficit or sensory deficit. She displays a negative Romberg sign. Coordination and gait normal. GCS eye subscore is 4. GCS verbal subscore is 5. GCS motor subscore is 6.  Skin: Skin is warm and dry.  Psychiatric: She has a normal mood and affect.  Nursing note and vitals reviewed.    ED Treatments / Results  Labs (all labs ordered are listed, but only abnormal results are displayed) Labs Reviewed  CBC - Abnormal; Notable for the following:       Result Value   Hemoglobin 8.8 (*)    HCT 30.9 (*)    MCV  66.3 (*)    MCH 18.9 (*)    MCHC 28.5 (*)    RDW 17.5 (*)    All other components within normal limits  BASIC METABOLIC PANEL  I-STAT TROPOININ, ED    EKG  EKG Interpretation  Date/Time:  Sunday August 07 2016 19:18:11 EST Ventricular Rate:  81 PR Interval:  130 QRS Duration: 82 QT Interval:  376 QTC Calculation: 436 R Axis:   58 Text Interpretation:  Normal sinus rhythm Nonspecific T wave abnormality Abnormal ECG since last tracing no significant change Confirmed by Hyacinth Meeker  MD, BRIAN ([id number]) on 08/07/2016 10:01:32 PM       Radiology Dg Chest 2 View  Result Date: 08/07/2016 CLINICAL DATA:  Chest pain for 1 day EXAM: CHEST  2 VIEW COMPARISON:  07/15/2015 FINDINGS: The heart size and mediastinal contours are within normal limits. Both lungs are clear. The visualized skeletal structures are unremarkable. IMPRESSION: No active cardiopulmonary disease. Electronically Signed   By: Alcide Clever M.D.   On: 08/07/2016 19:53    Procedures Procedures (including critical care time)  Medications Ordered in ED Medications  lidocaine (XYLOCAINE) 2 % viscous mouth solution 15 mL (15 mLs Mouth/Throat Given 08/07/16 2146)  alum & mag hydroxide-simeth (MAALOX/MYLANTA) 200-200-20 MG/5ML suspension 30 mL (30 mLs Oral Given 08/07/16 2147)     Initial Impression / Assessment and Plan / ED Course  I have reviewed the triage vital signs and the nursing notes.  Pertinent labs & imaging results that were available during my care of the patient were reviewed by me and considered in my medical decision making (see chart for details).    46 year old female with a history of hypertension presenting with low back pain and chest pain. Low back pain has been going on for 3 weeks and gradually worsening. No associated saddle anesthesia, bladder or bowel incontinence. No instability of gait. No numbness or weakness lotion release. No history of falls trauma. Doubt cauda equina. No abdominal tenderness or  abdominal pulsatile mass, doubt AAA. No headache, fevers, recent head trauma, doubt intracranial abnormality or infectious etiology Chest pain has been a burning sensation that is not increased with exertion and does not radiate. Her only cardiac risk factors hypertension and she has no family history of heart disease she is only 45 years old. Initial troponin is negative. Discretion of chest pain is consistent with reflux and there is low suspicion for ACS. Chest x-ray is unremarkable with no signs of pneumonia or pneumothorax and no wide mediastinum. BMP unremarkable. CBC with a hemoglobin of 8.8 is decreased from her prior of 11 however she has a history of iron deficiency anemia and states that she has not been taking her iron pills. Denies any hemoptysis, hematemesis, hematochezia or vaginal bleeding.  I will give her prescription for iron pills as well as Pepcid for reflux and Flexeril for back pain. I discharged her to follow up with her PCP the next available appointment for recheck of her hemoglobin and management of her back pain if it persists. Strict return precautions given patient is discharged in good condition.  Patient care discussed and supervised by my attending, Dr. Hyacinth Meeker. Azalia Bilis, MD   Final Clinical Impressions(s) / ED Diagnoses   Final diagnoses:  Acute bilateral low back pain without sciatica  Reflux esophagitis  Iron deficiency anemia, unspecified iron deficiency anemia type    New Prescriptions New Prescriptions   CYCLOBENZAPRINE (FLEXERIL) 10 MG TABLET    Take 1 tablet (10 mg total) by mouth 2 (two) times daily as needed for muscle spasms.   FAMOTIDINE (PEPCID) 20 MG TABLET    Take 1 tablet (20 mg total) by mouth 2 (two) times daily.   FERROUS SULFATE 325 (65 FE) MG TABLET    Take 1 tablet (325 mg total) by mouth daily.     Gerritt Galentine [place] Shreya Lacasse, MD 08/07/16 2351    Eber Hong, MD 08/17/16 [phone]

## 2016-10-05 ENCOUNTER — Telehealth: Payer: Self-pay | Admitting: Cardiology

## 2016-10-05 MED ORDER — BENAZEPRIL HCL 20 MG PO TABS: 20.0000 mg | ORAL_TABLET | Freq: Two times a day (BID) | ORAL | 0 refills | Status: DC

## 2016-10-05 NOTE — Telephone Encounter (Signed)
Spoke to patient   refill of Lotensin 20 mg  Bid  Patient aware she needs a appointment -to continue medication refill - appointment made.  e-sent refill to pharmacy  With 90 supply no refill

## 2016-10-05 NOTE — Telephone Encounter (Signed)
°*  STAT* If patient is at the pharmacy, call can be transferred to refill team.   1. Which medications need to be refilled? (please list name of each medication and dose if known) latsion   2. Which pharmacy/location (including street and city if local pharmacy) is medication to be sent to? [facility] in [place] 3. Do they need a 30 day or 90 day supply? 90

## 2016-10-16 NOTE — Progress Notes (Signed)
HPI The patient presents for evaluatio of chest discomfort, hypertension.   At the last visit I added chlorthalidone for her HTN.  She's been in the emergency room a couple times this year. I reviewed these records for this visit. She thought it was for chest discomfort but one of them lists back discomfort and the other vertigo. I don't see chest discomfort as being a major issue addressed by the ER doctors during this visit.  She reports her blood pressure is still running high. She describes 170s over 110s but I don't have a BP diary.  The patient denies any new symptoms such as chest discomfort, neck or arm discomfort. There has been no new shortness of breath, PND or orthopnea. There have been no reported palpitations, presyncope or syncope.  No Known Allergies  Current Outpatient Prescriptions  Medication Sig Dispense Refill  . benazepril (LOTENSIN) 20 MG tablet Take 1 tablet (20 mg total) by mouth 2 (two) times daily. 160 tablet 0  . chlorthalidone (HYGROTON) 25 MG tablet Take 1 tablet (25 mg total) by mouth daily. 30 tablet 6  . famotidine (PEPCID) 20 MG tablet Take 1 tablet (20 mg total) by mouth 2 (two) times daily. 30 tablet 0  . ferrous sulfate 325 (65 FE) MG tablet Take 1 tablet (325 mg total) by mouth daily. 30 tablet 0  . ibuprofen (ADVIL,MOTRIN) 600 MG tablet Take 1 tablet (600 mg total) by mouth every 6 (six) hours as needed for pain. 30 tablet 0  . ondansetron (ZOFRAN ODT) 4 MG disintegrating tablet Take 1 tablet (4 mg total) by mouth every 8 (eight) hours as needed for nausea or vomiting. 20 tablet 0  . potassium chloride SA (KLOR-CON M20) 20 MEQ tablet Take 1 tablet (20 mEq total) by mouth daily. 30 tablet 9   No current facility-administered medications for this visit.     Past Medical History:  Diagnosis Date  . Hypertension   . Palpitations     Past Surgical History:  Procedure Laterality Date  . CHOLECYSTECTOMY    . TUBAL LIGATION      ROS:    As stated in  the history of present illness and negative for all other systems.  PHYSICAL EXAM BP 134/76   Pulse 74   Ht  (1.499 m)   Wt 175 lb (79.4 kg)   BMI 35.35 kg/m  GENERAL:  Well appearing.  No distress NECK:  No jugular venous distention, waveform within normal limits, carotid upstroke brisk and symmetric, no bruits, no thyromegaly.   LUNGS:  Clear to auscultation bilaterally BACK:  No CVA tenderness CHEST:  Unremarkable HEART:  PMI not displaced or sustained,S1 and S2 within normal limits, no S3, no S4, no clicks, no rubs, no murmurs.  unc ABD:  Flat, positive bowel sounds normal in frequency in pitch, no bruits, no rebound, no guarding, no midline pulsatile mass, no hepatomegaly, no splenomegaly EXT:  2 plus pulses throughout, no edema, no cyanosis no clubbing    ASSESSMENT AND PLAN    Hypertension: I am going to order a 24 hour BP cuff to guide therapy  Overweight: We have had long discussions about exercise and healthy lifestyles .  Palpitations: She actually had 0% ectopy on Holter two years ago.  She is not complaining of palpitations currently.  No further therapy is indicated.  Chest pain:  She reports this at times but it is atypical and she had a negative POET (Plain Old Exercise Treadmill) in the  recent past.  No change in therapy or further testing is indicated.

## 2016-10-17 ENCOUNTER — Ambulatory Visit (INDEPENDENT_AMBULATORY_CARE_PROVIDER_SITE_OTHER): Payer: BLUE CROSS/BLUE SHIELD | Admitting: Cardiology

## 2016-10-17 VITALS — BP 134/76 | HR 74 | Ht 59.0 in | Wt 175.0 lb

## 2016-10-17 DIAGNOSIS — I10 Essential (primary) hypertension: Secondary | ICD-10-CM | POA: Diagnosis not present

## 2016-10-17 DIAGNOSIS — R072 Precordial pain: Secondary | ICD-10-CM

## 2016-10-17 NOTE — Patient Instructions (Signed)
Medication Instructions:  Continue current medications  Labwork: None Ordered  Testing/Procedures: Your physician has recommended that you wear a 24 hour blood pressure cuff. This is put on at our [employer]  Follow-Up: Your physician recommends that you schedule a follow-up appointment in: 1 Month   Any Other Special Instructions Will Be Listed Below (If Applicable).   If you need a refill on your cardiac medications before your next appointment, please call your pharmacy.

## 2016-10-18 ENCOUNTER — Encounter: Payer: Self-pay | Admitting: Cardiology

## 2016-10-27 ENCOUNTER — Encounter: Payer: Self-pay | Admitting: *Deleted

## 2016-10-27 ENCOUNTER — Ambulatory Visit (INDEPENDENT_AMBULATORY_CARE_PROVIDER_SITE_OTHER): Payer: BLUE CROSS/BLUE SHIELD

## 2016-10-27 DIAGNOSIS — I10 Essential (primary) hypertension: Secondary | ICD-10-CM | POA: Diagnosis not present

## 2016-10-27 NOTE — Progress Notes (Signed)
Patient ID: Paula Nelson, female   DOB: [birth year], 46 y.o.   MRN: [id number] 24 hour ambulatory blood pressure monitor applied to patient using adult regular cuff.

## 2016-11-06 NOTE — Progress Notes (Signed)
    HPI The patient presents for evaluation of chest discomfort, hypertension.   At a previous visit I added chlorthalidone for her HTN. At the last visit I ordered a 24 hour monitor.  This confirmed that her BPs were not well controlled.  She tolerated the chlorthalidone.  The patient denies any new symptoms such as chest discomfort, neck or arm discomfort. There has been no new shortness of breath, PND or orthopnea. There have been no reported palpitations, presyncope or syncope.     No Known Allergies  Current Outpatient Prescriptions  Medication Sig Dispense Refill  . benazepril (LOTENSIN) 20 MG tablet Take 1 tablet (20 mg total) by mouth 2 (two) times daily. 160 tablet 0  . chlorthalidone (HYGROTON) 25 MG tablet Take 1 tablet (25 mg total) by mouth daily. 30 tablet 6  . famotidine (PEPCID) 20 MG tablet Take 1 tablet (20 mg total) by mouth 2 (two) times daily. 30 tablet 0  . ferrous sulfate 325 (65 FE) MG tablet Take 1 tablet (325 mg total) by mouth daily. 30 tablet 0  . ibuprofen (ADVIL,MOTRIN) 600 MG tablet Take 1 tablet (600 mg total) by mouth every 6 (six) hours as needed for pain. 30 tablet 0  . ondansetron (ZOFRAN ODT) 4 MG disintegrating tablet Take 1 tablet (4 mg total) by mouth every 8 (eight) hours as needed for nausea or vomiting. 20 tablet 0  . spironolactone (ALDACTONE) 25 MG tablet Take 1 tablet (25 mg total) by mouth daily. 90 tablet 3   No current facility-administered medications for this visit.     Past Medical History:  Diagnosis Date  . Hypertension   . Palpitations     Past Surgical History:  Procedure Laterality Date  . CHOLECYSTECTOMY    . TUBAL LIGATION      ROS:     As stated in the history of present illness and negative for all other systems.  PHYSICAL EXAM BP (!) 152/98 (BP Location: Right Arm, Patient Position: Sitting, Cuff Size: Normal)   Pulse 84   Ht 4\' 11"  (1.499 m)   Wt 174 lb 3.2 oz (79 kg)   BMI 35.18 kg/m  GENERAL:  Well appearing.    NECK:  No jugular venous distention, waveform within normal limits, carotid upstroke brisk and symmetric, no bruits, no thyromegaly.   LUNGS:  Clear to auscultation bilaterally BACK:  No CVA tenderness HEART:  PMI not displaced or sustained,S1 and S2 within normal limits, no S3, no S4, no clicks, no rubs, no murmurs.  Unchanged from previous exam.   ABD:  Flat, positive bowel sounds normal in frequency in pitch, no bruits, no rebound, no guarding, no midline pulsatile mass, no hepatomegaly, no splenomegaly EXT:  2 plus pulses throughout, no edema, no cyanosis no clubbing  EKG:  NA   ASSESSMENT AND PLAN   Hypertension: BP is elevated still.  I will add spironolactone and stop the Kdur.  She will have a BMET in 10 days.  Of note she had a sleep study in 2015 that was "mild".  I will consider repeating this in the future.   Overweight: I gave her a goal of losing 18 lbs in 12 months.    Palpitations: She had 0% ectopy on Holter two years ago.  No change in therapy is needed  Chest pain:  She is no longer having this.

## 2016-11-07 ENCOUNTER — Encounter: Payer: Self-pay | Admitting: Cardiology

## 2016-11-07 ENCOUNTER — Ambulatory Visit (INDEPENDENT_AMBULATORY_CARE_PROVIDER_SITE_OTHER): Payer: BLUE CROSS/BLUE SHIELD | Admitting: Cardiology

## 2016-11-07 VITALS — BP 152/98 | HR 84 | Ht 59.0 in | Wt 174.2 lb

## 2016-11-07 DIAGNOSIS — E669 Obesity, unspecified: Secondary | ICD-10-CM

## 2016-11-07 DIAGNOSIS — Z6835 Body mass index (BMI) 35.0-35.9, adult: Secondary | ICD-10-CM

## 2016-11-07 DIAGNOSIS — Z79899 Other long term (current) drug therapy: Secondary | ICD-10-CM

## 2016-11-07 DIAGNOSIS — I10 Essential (primary) hypertension: Secondary | ICD-10-CM | POA: Diagnosis not present

## 2016-11-07 DIAGNOSIS — IMO0001 Reserved for inherently not codable concepts without codable children: Secondary | ICD-10-CM | POA: Insufficient documentation

## 2016-11-07 MED ORDER — SPIRONOLACTONE 25 MG PO TABS: 25.0000 mg | ORAL_TABLET | Freq: Every day | ORAL | 3 refills | Status: DC

## 2016-11-07 NOTE — Patient Instructions (Signed)
Medication Instructions:  STOP- Potassium START- Spironolactone 25 mg daily  Labwork: BMP 2 Weeks  Testing/Procedures: None Ordered  Follow-Up: Your physician recommends that you schedule a follow-up appointment in: 2 Months.   Any Other Special Instructions Will Be Listed Below (If Applicable).   If you need a refill on your cardiac medications before your next appointment, please call your pharmacy.

## 2016-11-23 LAB — BASIC METABOLIC PANEL
BUN: 10 mg/dL (ref 7–25)
CALCIUM: 9.6 mg/dL (ref 8.6–10.2)
CHLORIDE: 107 mmol/L (ref 98–110)
CO2: 21 mmol/L (ref 20–31)
CREATININE: 0.91 mg/dL (ref 0.50–1.10)
Glucose, Bld: 60 mg/dL — ABNORMAL LOW (ref 65–99)
POTASSIUM: 4.4 mmol/L (ref 3.5–5.3)
SODIUM: 140 mmol/L (ref 135–146)

## 2016-11-29 ENCOUNTER — Telehealth: Payer: Self-pay | Admitting: *Deleted

## 2016-11-29 ENCOUNTER — Telehealth: Payer: Self-pay | Admitting: Cardiology

## 2016-11-29 NOTE — Telephone Encounter (Signed)
Left message to call back for lab results:  Notes recorded by Rollene RotundaHochrein, James, MD on 11/26/2016 at 1:54 PM EDT Labs OK. Call Ms. Villagran with the results and send results to Deatra JamesSun, Vyvyan, MD

## 2016-11-29 NOTE — Telephone Encounter (Signed)
Pt advised results of recent lab draw were OK per Dr. Antoine PocheHochrein. No further questions, pt voiced thanks for call.

## 2016-11-29 NOTE — Telephone Encounter (Signed)
New Message ° ° pt verbalized that she is returning call for rn °

## 2016-12-22 ENCOUNTER — Encounter: Payer: Self-pay | Admitting: Cardiology

## 2017-01-06 NOTE — Progress Notes (Signed)
    HPI The patient presents for evaluation of chest discomfort and hypertension.   At the last visit I added spironolactone and stopped Kdur.  She returns for follow up.  The patient denies any new symptoms such as chest discomfort, neck or arm discomfort. There has been no new shortness of breath, PND or orthopnea. There have been no reported palpitations, presyncope or syncope.  She tolerated this med change.  She has been to the gym.  She has done well with exercises there although at time she has noticed a fullness in her chest walking up the stairs where she lives.    No Known Allergies  Current Outpatient Prescriptions  Medication Sig Dispense Refill  . benazepril (LOTENSIN) 20 MG tablet Take 1 tablet (20 mg total) by mouth 2 (two) times daily. 160 tablet 0  . chlorthalidone (HYGROTON) 25 MG tablet Take 1 tablet (25 mg total) by mouth daily. 30 tablet 6  . famotidine (PEPCID) 20 MG tablet Take 1 tablet (20 mg total) by mouth 2 (two) times daily. 30 tablet 0  . ferrous sulfate 325 (65 FE) MG tablet Take 1 tablet (325 mg total) by mouth daily. 30 tablet 0  . ibuprofen (ADVIL,MOTRIN) 600 MG tablet Take 1 tablet (600 mg total) by mouth every 6 (six) hours as needed for pain. 30 tablet 0  . ondansetron (ZOFRAN ODT) 4 MG disintegrating tablet Take 1 tablet (4 mg total) by mouth every 8 (eight) hours as needed for nausea or vomiting. 20 tablet 0  . spironolactone (ALDACTONE) 50 MG tablet Take 1 tablet (50 mg total) by mouth daily. 90 tablet 3   No current facility-administered medications for this visit.     Past Medical History:  Diagnosis Date  . Hypertension   . Palpitations     Past Surgical History:  Procedure Laterality Date  . CHOLECYSTECTOMY    . TUBAL LIGATION      ROS:     As stated in the HPI and negative for all other systems.  PHYSICAL EXAM BP (!) 172/100   Pulse 100   Ht 4\' 11"  (1.499 m)   Wt 173 lb 3.2 oz (78.6 kg)   BMI 34.98 kg/m   GENERAL:  Well  appearing NECK:  No jugular venous distention, waveform within normal limits, carotid upstroke brisk and symmetric, no bruits, no thyromegaly LUNGS:  Clear to auscultation bilaterally CHEST:  Unremarkable HEART:  PMI not displaced or sustained,S1 and S2 within normal limits, no S3, no S4, no clicks, no rubs, no murmurs ABD:  Flat, positive bowel sounds normal in frequency in pitch, no bruits, no rebound, no guarding, no midline pulsatile mass, no hepatomegaly, no splenomegaly EXT:  2 plus pulses throughout, no edema, no cyanosis no clubbing    EKG:  NA   ASSESSMENT AND PLAN   Hypertension: BP is still elevated.  I will increase the spironolactone.  When she comes back in one months she will likely need another agent added.  She will need a BMET when she returns  Overweight: I reviewed again with her an 18 lb weight loss over 12 months.  Palpitations: She is not currently complaining of these.   She had 0% ectopy on Holter two years ago.  No change in therapy is needed  Chest pain:  She continues to have some atypical complaints.  However, this is not changed from the time of he last stress test.  At this time no further testing is planned.

## 2017-01-09 ENCOUNTER — Encounter: Payer: Self-pay | Admitting: Cardiology

## 2017-01-09 ENCOUNTER — Ambulatory Visit (INDEPENDENT_AMBULATORY_CARE_PROVIDER_SITE_OTHER): Payer: BLUE CROSS/BLUE SHIELD | Admitting: Cardiology

## 2017-01-09 VITALS — BP 172/100 | HR 100 | Ht 59.0 in | Wt 173.2 lb

## 2017-01-09 DIAGNOSIS — R002 Palpitations: Secondary | ICD-10-CM

## 2017-01-09 DIAGNOSIS — R079 Chest pain, unspecified: Secondary | ICD-10-CM | POA: Diagnosis not present

## 2017-01-09 DIAGNOSIS — I10 Essential (primary) hypertension: Secondary | ICD-10-CM | POA: Diagnosis not present

## 2017-01-09 MED ORDER — SPIRONOLACTONE 50 MG PO TABS: 50.0000 mg | ORAL_TABLET | Freq: Every day | ORAL | 3 refills | Status: DC

## 2017-01-09 NOTE — Patient Instructions (Signed)
Medication Instructions:  INCREASE- Spironolactone 50 mg daily  Labwork: None Ordered  Testing/Procedures: None Ordered  Follow-Up: Your physician recommends that you schedule a follow-up appointment in: 1 Month.   Any Other Special Instructions Will Be Listed Below (If Applicable).   If you need a refill on your cardiac medications before your next appointment, please call your pharmacy.

## 2017-02-17 ENCOUNTER — Telehealth: Payer: Self-pay | Admitting: Cardiology

## 2017-02-17 NOTE — Telephone Encounter (Signed)
Spoke with pt she states that she has been feeling SOB fatigued and vomiting because she cannot catch her breath. She states that this is happening more frequently than before now it is every time she goes up the stairs, she lives on the 3rd floor,and when she exerts herself at work. She denies any palpitations or syncope.she states that she has been having headaches since before her last appt with Dr Antoine Poche and has dicussed this recently with PCP and they tell her to call cardiologist. She states that since her appt her BP has been high 176/96 is the best and she thinks the highest 183/104.  Spoke with Dr Royann Shivers (DOD) made appt with PA on Monday 8-27 and take it easy stay in the house if possible to avoid stressing her heart as going up the stair. Make sure to go to the ED if sx worsen. Pt verbalizes understanding and will go if this happens.

## 2017-02-17 NOTE — Telephone Encounter (Signed)
°  New Prob  Pt c/o of Chest Pain: STAT if CP now or developed within 24 hours  1. Are you having CP right now? Yes  2. Are you experiencing any other symptoms (ex. SOB, nausea, vomiting, sweating)? No  3. How long have you been experiencing CP? 4 days  4. Is your CP continuous or coming and going? Intermittent  5. Have you taken Nitroglycerin? No ?

## 2017-02-20 NOTE — Telephone Encounter (Signed)
S/w pt, changed appt to 02-21-17

## 2017-02-21 ENCOUNTER — Encounter: Payer: Self-pay | Admitting: Student

## 2017-02-21 ENCOUNTER — Ambulatory Visit (INDEPENDENT_AMBULATORY_CARE_PROVIDER_SITE_OTHER): Payer: BLUE CROSS/BLUE SHIELD | Admitting: Student

## 2017-02-21 VITALS — BP 150/104 | HR 81 | Ht 59.0 in | Wt 176.4 lb

## 2017-02-21 DIAGNOSIS — R072 Precordial pain: Secondary | ICD-10-CM | POA: Diagnosis not present

## 2017-02-21 DIAGNOSIS — R0609 Other forms of dyspnea: Secondary | ICD-10-CM

## 2017-02-21 DIAGNOSIS — Z79899 Other long term (current) drug therapy: Secondary | ICD-10-CM

## 2017-02-21 DIAGNOSIS — I10 Essential (primary) hypertension: Secondary | ICD-10-CM

## 2017-02-21 LAB — BASIC METABOLIC PANEL
BUN: 11 mg/dL (ref 7–25)
CHLORIDE: 105 mmol/L (ref 98–110)
CO2: 25 mmol/L (ref 20–32)
CREATININE: 0.87 mg/dL (ref 0.50–1.10)
Calcium: 9.5 mg/dL (ref 8.6–10.2)
Glucose, Bld: 74 mg/dL (ref 65–99)
Potassium: 4.1 mmol/L (ref 3.5–5.3)
SODIUM: 139 mmol/L (ref 135–146)

## 2017-02-21 LAB — TROPONIN I

## 2017-02-21 LAB — BRAIN NATRIURETIC PEPTIDE: Brain Natriuretic Peptide: 11.2 pg/mL (ref <100)

## 2017-02-21 MED ORDER — CHLORTHALIDONE 25 MG PO TABS: 25.0000 mg | ORAL_TABLET | Freq: Every day | ORAL | 6 refills | Status: DC

## 2017-02-21 MED ORDER — NITROGLYCERIN 0.4 MG SL SUBL: 0.4000 mg | SUBLINGUAL_TABLET | SUBLINGUAL | 3 refills | Status: DC | PRN

## 2017-02-21 NOTE — Progress Notes (Signed)
Cardiology Office Note    Date:  02/21/2017   ID:  Paula Nelson, DOB 04-23-1971, MRN [id number]  PCP:  Deatra James, MD  Cardiologist: Dr. Antoine Poche   Chief Complaint  Patient presents with  . Follow-up    Dyspnea on Exertion and Chest Pain    History of Present Illness:    Paula Nelson is a 46 y.o. female with past medical history of HTN and palpitations who presents to the office today for evaluation of chest pain and dyspnea on exertion.  She was last examined by Dr. Antoine Poche in 01/2017 and reported doing well from a cardiac perspective at that time. She denied any recent chest discomfort or dyspnea on exertion and had been going to the gym multiple times per week. BP was elevated at 172/100, therefore Spironolactone was increased from 25mg  daily to 50mg  daily and she was continued on Benazepril 20mg  BID and Chlorthalidone 25mg  daily.   She called the office on 02/17/2017 and reported having worsening shortness of breath with climbing the stairs at her home. BP was still elevated in the 170's - 180's/90's, therefore close follow-up was arranged.   In talking with the patient today, she reports having episodes of dyspnea on exertion and chest discomfort for the past week. She notices the shortness of breath mostly when climbing stairs at her home. She had previously been walking on the treadmill daily but has been unable to do this due to her symptoms. Does note mild orthopnea. No PND or lower extremity edema. She denies any recent travel or calf pain. No family history of premature CAD.   She has also experienced occasional chest discomfort which she describes as a tightness across her entire right and left pectoral region which can last for up to 15 minutes and resolves with rest. Reports BP has been elevated when her pain occurs. Says she has been under increased stress at work as she is a Social research officer, government and wonders if this is a contributing factor to her symptoms.   In reviewing  medications, she has only been taking Benazepril and Spironolactone. Says she has not taken Chlorthalidone in at least 2 months due to not having an Rx for the medication.    Past Medical History:  Diagnosis Date  . Chest pain    a. 06/2015: ETT with EKG showing no acute changes.  . Hypertension   . Palpitations     Past Surgical History:  Procedure Laterality Date  . CHOLECYSTECTOMY    . TUBAL LIGATION      Current Medications: Outpatient Medications Prior to Visit  Medication Sig Dispense Refill  . benazepril (LOTENSIN) 20 MG tablet Take 1 tablet (20 mg total) by mouth 2 (two) times daily. 160 tablet 0  . famotidine (PEPCID) 20 MG tablet Take 1 tablet (20 mg total) by mouth 2 (two) times daily. 30 tablet 0  . ferrous sulfate 325 (65 FE) MG tablet Take 1 tablet (325 mg total) by mouth daily. 30 tablet 0  . ibuprofen (ADVIL,MOTRIN) 600 MG tablet Take 1 tablet (600 mg total) by mouth every 6 (six) hours as needed for pain. 30 tablet 0  . spironolactone (ALDACTONE) 50 MG tablet Take 1 tablet (50 mg total) by mouth daily. 90 tablet 3  . chlorthalidone (HYGROTON) 25 MG tablet Take 1 tablet (25 mg total) by mouth daily. 30 tablet 6  . ondansetron (ZOFRAN ODT) 4 MG disintegrating tablet Take 1 tablet (4 mg total) by mouth every 8 (eight) hours as needed  for nausea or vomiting. 20 tablet 0   No facility-administered medications prior to visit.      Allergies:   Patient has no known allergies.   Social History   Social History  . Marital status: Married    Spouse name: N/A  . Number of children: 3  . Years of education: N/A   Occupational History  . 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 Smoking status: Never Smoker  . Smokeless tobacco: Never Used  . Alcohol use Yes  . Drug use: No  . Sexual activity: Yes    Birth control/ protection: None, Surgical   Other Topics Concern  . None   Social History Narrative   Lives at home with one teenager.        Family History:  The patient's family history includes Atrial fibrillation in her father; Hypertension in her father and mother.   Review of Systems:   Please see the history of present illness.     General:  No chills, fever, night sweats or weight changes.  Cardiovascular:  No edema, orthopnea, palpitations, paroxysmal nocturnal dyspnea. Positive for chest pain and dyspnea on exertion.  Dermatological: No rash, lesions/masses Respiratory: No cough, dyspnea Urologic: No hematuria, dysuria Abdominal:   No nausea, vomiting, diarrhea, bright red blood per rectum, melena, or hematemesis Neurologic:  No visual changes, wkns, changes in mental status. All other systems reviewed and are otherwise negative except as noted above.   Physical Exam:    VS:  BP (!) 150/104   Pulse 81   Ht 4\' 11"  (1.499 m)   Wt 176 lb 6.4 oz (80 kg)   BMI 35.63 kg/m    General: Well developed, overweight African American female appearing in no acute distress. Head: Normocephalic, atraumatic, sclera non-icteric, no xanthomas, nares are without discharge.  Neck: No carotid bruits. JVD not elevated.  Lungs: Respirations regular and unlabored, without wheezes or rales.  Heart: Regular rate and rhythm. No S3 or S4.  No murmur, no rubs, or gallops appreciated. Abdomen: Soft, non-tender, non-distended with normoactive bowel sounds. No hepatomegaly. No rebound/guarding. No obvious abdominal masses. Msk:  Strength and tone appear normal for age. No joint deformities or effusions. Extremities: No clubbing or cyanosis. No lower extremity edema.  Distal pedal pulses are 2+ bilaterally. Neuro: Alert and oriented X 3. Moves all extremities spontaneously. No focal deficits noted. Psych:  Responds to questions appropriately with a normal affect. Skin: No rashes or lesions noted  Wt Readings from Last 3 Encounters:  02/21/17 176 lb 6.4 oz (80 kg)  01/09/17 173 lb 3.2 oz (78.6 kg)  11/07/16 174 lb 3.2 oz (79 kg)      Studies/Labs Reviewed:   EKG:  EKG is ordered today. The ekg ordered today demonstrates NSR, HR 81, with no acute ST or T- wave changes when compared to prior tracings.   Recent Labs: 08/07/2016: Hemoglobin 8.8; Platelets 273 02/21/2017: Brain Natriuretic Peptide 11.2; BUN 11; Creat 0.87; Potassium 4.1; Sodium 139   Lipid Panel No results found for: CHOL, TRIG, HDL, CHOLHDL, VLDL, LDLCALC, LDLDIRECT  Additional studies/ records that were reviewed today include:   ETT: 06/2015  There was no ST segment deviation noted during stress.   ETT with fair exercise tolerance (6:34); patient did complain of chest pain with exercise; normal BP response; no ST changes; negative adequate ETT.  Assessment:    1. DOE (dyspnea on exertion)   2. Precordial pain   3. Medication management  4. Essential hypertension      Plan:   In order of problems listed above:  1. Dyspnea on Exertion/ Chest Pain - she reports having episodes of dyspnea on exertion and chest discomfort for the past week. Notices this mostly when climbing stairs at her home or walking on the treadmill. Pain typically lasts 10-15 minutes and resolves with rest. Does note mild orthopnea. No PND or lower extremity edema. Reports BP has been elevated when her pain occurs.  - she denies any recent travel or calf pain. No known HLD, Type 2 DM, tobacco use, or family history of premature CAD.  - EKG is without acute ischemic changes. Will check Troponin and BNP today as the acute onset of her symptoms is concerning. If labs are negative, plan for Treadmill Myoview to assess for ischemia.   2. Essential HTN - BP remains elevated at 150/104 during today's visit.  - she has only been taking Benazepril and Spironolactone as she reports not having an Rx for Chlorthalidone. - will continue Benazepril 20mg  BID and Spironolactone 50mg  daily along with resuming Chlorthalidone 25mg  daily. Recheck BMET today to assess kidney function.      Medication Adjustments/Labs and Tests Ordered: Current medicines are reviewed at length with the patient today.  Concerns regarding medicines are outlined above.  Medication changes, Labs and Tests ordered today are listed in the Patient Instructions below. Patient Instructions   Medication Instructions:  START CHLORTHALIDONE 25MG  DAILY NITRO AS NEEDED  If you need a refill on your cardiac medications before your next appointment, please call your pharmacy.  Labwork: STAT TROPONIN, BNP AND BMET TODAY HERE IN OUR OFFICE AT [facility]  Testing/Procedures: Your physician has requested that you have a exercise myoview. For further information please visit [email or web address] Please follow instruction sheet, as given.  Follow-Up: Your physician wants you to follow-up in: CHANGE HOCHREIN APPOINTMENT NEXT WEEK IF UNABLE TO HAVE TESTING BEFORE THIS APPOINTMENT.   Thank you for choosing [facility] at [facility], [place], [place]  02/21/2017 8:19 PM    [facility] [address] [place], [address] [place], [place]  [address] Phone: [phone]; Fax: [phone]  [address], [address] [place], [place] [address] Phone: [phone]

## 2017-02-21 NOTE — Patient Instructions (Signed)
Medication Instructions:  START CHLORTHALIDONE 25MG  DAILY NITRO AS NEEDED  If you need a refill on your cardiac medications before your next appointment, please call your pharmacy.  Labwork: STAT TROPONIN, BNP AND BMET TODAY HERE IN OUR OFFICE AT [facility]  Testing/Procedures: Your physician has requested that you have a exercise myoview. For further information please visit [email or web address] Please follow instruction sheet, as given.   Follow-Up: Your physician wants you to follow-up in: CHANGE HOCHREIN APPOINTMENT NEXT WEEK IF UNABLE TO HAVE TESTING BEFORE THIS APPOINTMENT.   Thank you for choosing [facility] at [facility]!!

## 2017-02-22 ENCOUNTER — Telehealth (HOSPITAL_COMMUNITY): Payer: Self-pay

## 2017-02-22 NOTE — Telephone Encounter (Signed)
Encounter complete. 

## 2017-02-24 ENCOUNTER — Ambulatory Visit (HOSPITAL_COMMUNITY)
Admission: RE | Admit: 2017-02-24 | Discharge: 2017-02-24 | Disposition: A | Discharge status: Home / Self Care | Payer: BLUE CROSS/BLUE SHIELD | Source: Ambulatory Visit | Attending: Internal Medicine | Admitting: Internal Medicine

## 2017-02-24 DIAGNOSIS — R0609 Other forms of dyspnea: Secondary | ICD-10-CM

## 2017-02-24 DIAGNOSIS — R072 Precordial pain: Secondary | ICD-10-CM

## 2017-02-24 LAB — MYOCARDIAL PERFUSION IMAGING
CHL CUP NUCLEAR SDS: 1
CHL CUP NUCLEAR SSS: 2
CHL CUP RESTING HR STRESS: 72 {beats}/min
CSEPPHR: 120 {beats}/min
LV dias vol: 70 mL (ref 46–106)
LV sys vol: 26 mL
SRS: 1
TID: 1.34

## 2017-02-24 MED ORDER — TECHNETIUM TC 99M TETROFOSMIN IV KIT
32.0000 | PACK | Freq: Once | INTRAVENOUS | Status: AC | PRN
  Administered 2017-02-24: 32 via INTRAVENOUS
  Filled 2017-02-24: qty 32

## 2017-02-24 MED ORDER — REGADENOSON 0.4 MG/5ML IV SOLN
0.4000 mg | Freq: Once | INTRAVENOUS | Status: AC
  Administered 2017-02-24: 0.4 mg via INTRAVENOUS

## 2017-02-24 MED ORDER — TECHNETIUM TC 99M TETROFOSMIN IV KIT
10.9000 | PACK | Freq: Once | INTRAVENOUS | Status: AC | PRN
  Administered 2017-02-24: 10.9 via INTRAVENOUS
  Filled 2017-02-24: qty 11

## 2017-02-27 ENCOUNTER — Ambulatory Visit: Payer: BLUE CROSS/BLUE SHIELD | Admitting: Student

## 2017-03-02 NOTE — Progress Notes (Deleted)
HPI The patient presents for evaluation of chest discomfort and hypertension.  She recently called and was seen in the clinic because of worsening chest pain and HTN.  She had not been taking her chlorthalidone.  She had an exercise perfusion study which was negative for ischemia.  ***    At the last visit I added spironolactone and stopped Kdur.  She returns for follow up.  The patient denies any new symptoms such as chest discomfort, neck or arm discomfort. There has been no new shortness of breath, PND or orthopnea. There have been no reported palpitations, presyncope or syncope.  She tolerated this med change.  She has been to the gym.  She has done well with exercises there although at time she has noticed a fullness in her chest walking up the stairs where she lives.    No Known Allergies  Current Outpatient Prescriptions  Medication Sig Dispense Refill  . benazepril (LOTENSIN) 20 MG tablet Take 1 tablet (20 mg total) by mouth 2 (two) times daily. 160 tablet 0  . chlorthalidone (HYGROTON) 25 MG tablet Take 1 tablet (25 mg total) by mouth daily. 30 tablet 6  . famotidine (PEPCID) 20 MG tablet Take 1 tablet (20 mg total) by mouth 2 (two) times daily. 30 tablet 0  . ferrous sulfate 325 (65 FE) MG tablet Take 1 tablet (325 mg total) by mouth daily. 30 tablet 0  . ibuprofen (ADVIL,MOTRIN) 600 MG tablet Take 1 tablet (600 mg total) by mouth every 6 (six) hours as needed for pain. 30 tablet 0  . nitroGLYCERIN (NITROSTAT) 0.4 MG SL tablet Place 1 tablet (0.4 mg total) under the tongue every 5 (five) minutes as needed for chest pain. 90 tablet 3  . spironolactone (ALDACTONE) 50 MG tablet Take 1 tablet (50 mg total) by mouth daily. 90 tablet 3   No current facility-administered medications for this visit.     Past Medical History:  Diagnosis Date  . Chest pain    a. 06/2015: ETT with EKG showing no acute changes.  . Hypertension   . Palpitations     Past Surgical History:  Procedure  Laterality Date  . CHOLECYSTECTOMY    . TUBAL LIGATION      ROS:    ***  PHYSICAL EXAM There were no vitals taken for this visit.  GENERAL:  Well appearing NECK:  No jugular venous distention, waveform within normal limits, carotid upstroke brisk and symmetric, no bruits, no thyromegaly LUNGS:  Clear to auscultation bilaterally CHEST:  Unremarkable HEART:  PMI not displaced or sustained,S1 and S2 within normal limits, no S3, no S4, no clicks, no rubs, *** murmurs ABD:  Flat, positive bowel sounds normal in frequency in pitch, no bruits, no rebound, no guarding, no midline pulsatile mass, no hepatomegaly, no splenomegaly EXT:  2 plus pulses throughout, no edema, no cyanosis no clubbing   GENERAL:  Well appearing NECK:  No jugular venous distention, waveform within normal limits, carotid upstroke brisk and symmetric, no bruits, no thyromegaly LUNGS:  Clear to auscultation bilaterally CHEST:  Unremarkable HEART:  PMI not displaced or sustained,S1 and S2 within normal limits, no S3, no S4, no clicks, no rubs, no murmurs ABD:  Flat, positive bowel sounds normal in frequency in pitch, no bruits, no rebound, no guarding, no midline pulsatile mass, no hepatomegaly, no splenomegaly EXT:  2 plus pulses throughout, no edema, no cyanosis no clubbing    EKG:  ***   ASSESSMENT AND PLAN  Chest pain: She had a negative perfusion study with a normal EF.  ***  Hypertension: She was given a prescription for chlorthalidone which she did not have.  ***  BP is still elevated.  I will increase the spironolactone.  When she comes back in one months she will likely need another agent added.  She will need a BMET when she returns  Overweight: ***  I reviewed again with her an 18 lb weight loss over 12 months.  Palpitations: ***   She is not currently complaining of these.   She had 0% ectopy on Holter two years ago.  No change in therapy is needed

## 2017-03-03 ENCOUNTER — Ambulatory Visit: Payer: BLUE CROSS/BLUE SHIELD | Admitting: Cardiology

## 2017-04-11 ENCOUNTER — Ambulatory Visit (INDEPENDENT_AMBULATORY_CARE_PROVIDER_SITE_OTHER): Payer: BLUE CROSS/BLUE SHIELD | Admitting: Cardiology

## 2017-04-11 ENCOUNTER — Encounter: Payer: Self-pay | Admitting: Cardiology

## 2017-04-11 VITALS — BP 160/94 | HR 84 | Ht 59.0 in | Wt 175.0 lb

## 2017-04-11 DIAGNOSIS — R5383 Other fatigue: Secondary | ICD-10-CM

## 2017-04-11 DIAGNOSIS — G4730 Sleep apnea, unspecified: Secondary | ICD-10-CM

## 2017-04-11 DIAGNOSIS — I10 Essential (primary) hypertension: Secondary | ICD-10-CM | POA: Diagnosis not present

## 2017-04-11 MED ORDER — AMLODIPINE BESYLATE 5 MG PO TABS: 5.0000 mg | ORAL_TABLET | Freq: Every day | ORAL | 3 refills | Status: DC

## 2017-04-11 NOTE — Patient Instructions (Signed)
Medication Instructions:  START- Amlodipine 5 mg daily  If you need a refill on your cardiac medications before your next appointment, please call your pharmacy.  Labwork: None Ordered   Testing/Procedures: Your physician has recommended that you have a sleep study. This test records several body functions during sleep, including: brain activity, eye movement, oxygen and carbon dioxide blood levels, heart rate and rhythm, breathing rate and rhythm, the flow of air through your mouth and nose, snoring, body muscle movements, and chest and belly movement.  Follow-Up: Your physician wants you to follow-up in: 1 Month.    Thank you for choosing [facility]!!

## 2017-04-11 NOTE — Progress Notes (Signed)
Cardiology Office Note   Date:  04/12/2017   ID:  Paula Nelson, DOB August 26, 1970, MRN [id number]  PCP:  Paula Kelvin Sennett, MD  Cardiologist:   Rollene Rotunda, MD    Chief Complaint  Patient presents with  . Chest Pain      History of Present Illness: Paula Nelson is a 46 y.o. female who presents for evaluation of difficult to control HTN.  She was seen in the office in late December with dyspnea and elevated BPs.   She had a negative troponin, normal BNP and a Lexiscan Myoview without ischemia or infarct.   Since then she has been very fatigued.  Her BPs are still not controlled.  She continues to have SBPs typically above 160 and diastolics above 110.  She tolerated increased spironolactone.  [name]       Past Medical History:  Diagnosis Date  . Chest pain    a. 06/2015: ETT with EKG showing no acute changes.  . Hypertension   . Palpitations     Past Surgical History:  Procedure Laterality Date  . CHOLECYSTECTOMY    . TUBAL LIGATION       Current Outpatient Prescriptions  Medication Sig Dispense Refill  . benazepril (LOTENSIN) 20 MG tablet Take 1 tablet (20 mg total) by mouth 2 (two) times daily. 160 tablet 0  . chlorthalidone (HYGROTON) 25 MG tablet Take 1 tablet (25 mg total) by mouth daily. 30 tablet 6  . famotidine (PEPCID) 20 MG tablet Take 1 tablet (20 mg total) by mouth 2 (two) times daily. 30 tablet 0  . ferrous sulfate 325 (65 FE) MG tablet Take 1 tablet (325 mg total) by mouth daily. 30 tablet 0  . ibuprofen (ADVIL,MOTRIN) 600 MG tablet Take 1 tablet (600 mg total) by mouth every 6 (six) hours as needed for pain. 30 tablet 0  . nitroGLYCERIN (NITROSTAT) 0.4 MG SL tablet Place 1 tablet (0.4 mg total) under the tongue every 5 (five) minutes as needed for chest pain. 90 tablet 3  . amLODipine (NORVASC) 5 MG tablet Take 1 tablet (5 mg total) by mouth daily. 90 tablet 3  .  spironolactone (ALDACTONE) 50 MG tablet Take 1 tablet (50 mg total) by mouth daily. 90 tablet 3   No current facility-administered medications for this visit.     Allergies:   Patient has no known allergies.    ROS:  Please see the history of present illness.   Otherwise, review of systems are positive for none.   All other systems are reviewed and negative.    PHYSICAL EXAM: VS:  BP (!) 160/94   Pulse 84   Ht  (1.499 m)   Wt 175 lb (79.4 kg)   BMI 35.35 kg/m  , BMI Body mass index is 35.35 kg/m. GENERAL:  Well appearing NECK:  No jugular venous distention, waveform within normal limits, carotid upstroke brisk and symmetric, no bruits, no thyromegaly LUNGS:  Clear to auscultation bilaterally BACK:  No CVA tenderness CHEST:  Unremarkable HEART:  PMI not displaced or sustained,S1 and S2 within normal limits, no S3, no S4, no clicks, no rubs, no murmurs ABD:  Flat, positive bowel sounds normal in frequency in pitch, no bruits, no rebound, no guarding, no midline pulsatile mass, no hepatomegaly, no splenomegaly EXT:  2 plus pulses throughout, no edema, no cyanosis no clubbing    EKG:  EKG is  not ordered today.    Recent Labs: 08/07/2016: Hemoglobin 8.8; Platelets 273 02/21/2017: Brain Natriuretic Peptide 11.2; BUN 11; Creat 0.87; Potassium 4.1; Sodium 139    Lipid Panel No results found for: CHOL, TRIG, HDL, CHOLHDL, VLDL, LDLCALC, LDLDIRECT    Wt Readings from Last 3 Encounters:  04/11/17 175 lb (79.4 kg)  02/24/17 176 lb (79.8 kg)  02/21/17 176 lb 6.4 oz (80 kg)      Other studies Reviewed: Additional studies/ records that were reviewed today include: [employer], labs. Review of the above records demonstrates:  Please see elsewhere in the note.     ASSESSMENT AND PLAN:  Hypertension: Today I will titrate her meds and add Norvasc 5 mg daily.  She will continue with her BP diary.   Overweight: Her weight has been steady and she understands weight  control will help her BP control.    Chest pain:  This is atypical with a negative nuclear study.   I do not think that further testing is indicated at this point.  She will follow up with Paula Ange Puskas, MD about this.  Fatigue: She had mild sleep apnea in the past .  I will order a repeat sleep study.    Current medicines are reviewed at length with the patient today.  The patient does not have concerns regarding medicines.  The following changes have been made:  As above  Labs/ tests ordered today include:   Orders Placed This Encounter  Procedures  . Split night study     Disposition:   FU with me in one month.     Signed, Rollene Rotunda, MD  04/12/2017 9:14 AM    [facility]

## 2017-04-12 ENCOUNTER — Encounter: Payer: Self-pay | Admitting: Cardiology

## 2017-04-12 ENCOUNTER — Telehealth: Payer: Self-pay | Admitting: Cardiology

## 2017-04-12 DIAGNOSIS — R5383 Other fatigue: Secondary | ICD-10-CM | POA: Insufficient documentation

## 2017-04-12 DIAGNOSIS — G4730 Sleep apnea, unspecified: Secondary | ICD-10-CM | POA: Insufficient documentation

## 2017-04-12 NOTE — Telephone Encounter (Signed)
Returned call to patient she stated she decided to have sleep study done.Advised scheduler will call back to schedule.

## 2017-04-12 NOTE — Telephone Encounter (Signed)
New message    Pt is calling asking for a call back about scheduling sleep study.

## 2017-05-29 ENCOUNTER — Ambulatory Visit (HOSPITAL_BASED_OUTPATIENT_CLINIC_OR_DEPARTMENT_OTHER): Payer: Self-pay | Attending: Cardiology | Admitting: Cardiovascular Disease

## 2017-05-29 VITALS — Ht 59.0 in | Wt 176.0 lb

## 2017-05-29 DIAGNOSIS — Z6836 Body mass index (BMI) 36.0-36.9, adult: Secondary | ICD-10-CM | POA: Insufficient documentation

## 2017-05-29 DIAGNOSIS — R0683 Snoring: Secondary | ICD-10-CM | POA: Insufficient documentation

## 2017-05-29 DIAGNOSIS — I10 Essential (primary) hypertension: Secondary | ICD-10-CM | POA: Insufficient documentation

## 2017-05-29 DIAGNOSIS — R5383 Other fatigue: Secondary | ICD-10-CM | POA: Insufficient documentation

## 2017-05-29 DIAGNOSIS — G4719 Other hypersomnia: Secondary | ICD-10-CM | POA: Insufficient documentation

## 2017-05-29 DIAGNOSIS — G4730 Sleep apnea, unspecified: Secondary | ICD-10-CM | POA: Insufficient documentation

## 2017-05-29 DIAGNOSIS — Z79899 Other long term (current) drug therapy: Secondary | ICD-10-CM | POA: Insufficient documentation

## 2017-05-29 DIAGNOSIS — E669 Obesity, unspecified: Secondary | ICD-10-CM | POA: Insufficient documentation

## 2017-06-17 ENCOUNTER — Encounter (HOSPITAL_BASED_OUTPATIENT_CLINIC_OR_DEPARTMENT_OTHER): Payer: Self-pay | Admitting: Cardiovascular Disease

## 2017-06-17 NOTE — Procedures (Signed)
Patient Name: Paula Nelson, Octavie Study Date: 05/29/2017 Gender: Female D.O.B: 11/05/1970 Age (years): 46 Referring Provider: Rollene RotundaJames Hochrein Height (inches): 59 Interpreting Physician: Nicki Guadalajarahomas Kelly MD, ABSM Weight (lbs): 176 RPSGT: Wylie HailDavis, Rico BMI: 36 MRN: [id number] Neck Size: 14.00   CLINICAL INFORMATION Sleep Study Type: NPSG  Indication for sleep study: Fatigue, Hypertension, Obesity.  Reevaluation for previously documented increased upper airway resistance syndrome.  In 2015, a sleep study showed an AHI of 0.7, RDI 6.7, with oxygen desaturation to 94%.  Epworth Sleepiness Score: 12  SLEEP STUDY TECHNIQUE As per the AASM Manual for the Scoring of Sleep and Associated Events v2.3 (April 2016) with a hypopnea requiring 4% desaturations.  The channels recorded and monitored were frontal, central and occipital EEG, electrooculogram (EOG), submentalis EMG (chin), nasal and oral airflow, thoracic and abdominal wall motion, anterior tibialis EMG, snore microphone, electrocardiogram, and pulse oximetry.  MEDICATIONS     amLODipine (NORVASC) 5 MG tablet         benazepril (LOTENSIN) 20 MG tablet         chlorthalidone (HYGROTON) 25 MG tablet         famotidine (PEPCID) 20 MG tablet         ferrous sulfate 325 (65 FE) MG tablet         ibuprofen (ADVIL,MOTRIN) 600 MG tablet         nitroGLYCERIN (NITROSTAT) 0.4 MG SL tablet (Expired)         spironolactone (ALDACTONE) 50 MG tablet (Expired)      Medications self-administered by patient taken the night of the study : N/A  SLEEP ARCHITECTURE The study was initiated at 9:51:39 PM and ended at 4:25:13 AM.  Sleep onset time was 31.4 minutes and the sleep efficiency was 64.2%. The total sleep time was 252.6 minutes.  Stage REM latency was 183.5 minutes.  The patient spent 14.51% of the night in stage N1 sleep, 71.44% in stage N2 sleep, 0.40% in stage N3 and 13.66% in REM.  Alpha intrusion was absent.  Supine sleep was  2.91%.  RESPIRATORY PARAMETERS The overall apnea/hypopnea index (AHI) was 3.1 per hour.  the respiratory disturbance index (RDI is 6.4 per hour. There were 1 total apneas, including 1 obstructive, 0 central and 0 mixed apneas. There were 12 hypopneas and 14 RERAs.  The AHI during Stage REM sleep was 12.2 per hour.  AHI while supine was 8.2 per hour.  The mean oxygen saturation was 97.23%. The minimum SpO2 during sleep was 90.00%.  Loud snoring was noted during this study.  CARDIAC DATA The 2 lead EKG demonstrated sinus rhythm. The mean heart rate was 74.99 beats per minute. Other EKG findings include: None.  LEG MOVEMENT DATA The total PLMS were 0 with a resulting PLMS index of 0.00. Associated arousal with leg movement index was 0.0 .  IMPRESSIONS - Increased upper airway resistance syndrome/borderline mild sleep apnea overall with an AHI of 3.1/h an RDI of 6.4/h. There is mild sleep apnea with supine sleep at 8.2/h and during REM sleep with an AHI of 12.2/h. - No significant central sleep apnea occurred during this study (CAI = 0.0/h). - Mild oxygen desaturation to a nadir of 90%. - Abnormal sleep architecture with reduction in slow-wave sleep and prolonged latency to REM sleep. - Reduced sleep efficiency at 64.2%. - Excessive daytime sleepiness - The patient snored with loud snoring volume. - No cardiac abnormalities were noted during this study. - Clinically significant periodic limb movements did not  occur during sleep. No significant associated arousals.  DIAGNOSIS - Sleep apnea, unspecified type .  G47.30 - Excessive daytime sleepiness - Snoring  RECOMMENDATIONS - Since 2015, the patient has continued to have daytime sleepiness, and there is now sleep apnea with supine sleep and during REM sleep.  - Efforts should be made to optimize nasal and oral pharyngeal patency.   - The patient should be counseled to try to avoid supine sleep; consider positional therapy. -  Consider initial alternatives to CPAP therapy, such as a customized oral dental appliance or ENT evaluation for snoring.  However, if patient continues to be symptomatic, CPAP therapy will be beneficial - Avoid alcohol, sedatives and other CNS depressants that may worsen sleep apnea and disrupt normal sleep architecture. - Sleep hygiene should be reviewed to assess factors that may improve sleep quality. - Weight management (BMI 36) and regular exercise should be initiated or continued if appropriate.  [Electronically signed] 06/17/2017 10:56 AM  Nicki Guadalajarahomas Kelly MD, [facility] Regional Medical CenterFACC, ABSM Diplomate, American Board of Sleep Medicine   NPI: [id number] [facility] PH: [phone]   FX: [phone] ACCREDITED BY THE AMERICAN ACADEMY OF SLEEP MEDICINE

## 2017-06-28 ENCOUNTER — Telehealth: Payer: Self-pay | Admitting: *Deleted

## 2017-06-28 NOTE — Progress Notes (Signed)
lmtcb 12/26 

## 2017-06-28 NOTE — Telephone Encounter (Signed)
Lennette BihariKelly, Thomas A, MD  Rollene RotundaHochrein, James, MD; Harvel RicksSharpe, Augusto Deckman A, RN        [facility] , please notify the patient the results of her sleep study. This is slightly increased from 2015, but demonstrates increased upper airway resistance. Overall/borderline sleep apnea. There is very mild sleep apnea with supine posture and mild sleep apnea during REM sleep. See recommendations.  Consider follow-up evaluation in office for further discussion and recommendations.     Left message to call back

## 2017-07-26 NOTE — Progress Notes (Signed)
lmtcb 1/23 

## 2017-07-26 NOTE — Telephone Encounter (Signed)
Left message to call back  

## 2017-08-17 NOTE — Telephone Encounter (Signed)
Left message to call back.  Attempt x 3-letter mailed to patient to contact us in regards to results.

## 2017-08-17 NOTE — Progress Notes (Signed)
2/14: Attempt x 3 without success, lmtcb.  Letter mailed to patient requesting to call office to discuss results.

## 2017-11-30 ENCOUNTER — Emergency Department (HOSPITAL_BASED_OUTPATIENT_CLINIC_OR_DEPARTMENT_OTHER)
Admission: EM | Admit: 2017-11-30 | Discharge: 2017-12-01 | Disposition: A | Discharge status: Home / Self Care | Payer: Self-pay | Attending: Emergency Medicine | Admitting: Emergency Medicine

## 2017-11-30 ENCOUNTER — Encounter (HOSPITAL_BASED_OUTPATIENT_CLINIC_OR_DEPARTMENT_OTHER): Payer: Self-pay

## 2017-11-30 ENCOUNTER — Other Ambulatory Visit: Payer: Self-pay

## 2017-11-30 ENCOUNTER — Emergency Department (HOSPITAL_BASED_OUTPATIENT_CLINIC_OR_DEPARTMENT_OTHER): Payer: Self-pay

## 2017-11-30 DIAGNOSIS — Z79899 Other long term (current) drug therapy: Secondary | ICD-10-CM | POA: Insufficient documentation

## 2017-11-30 DIAGNOSIS — D508 Other iron deficiency anemias: Secondary | ICD-10-CM | POA: Insufficient documentation

## 2017-11-30 DIAGNOSIS — I10 Essential (primary) hypertension: Secondary | ICD-10-CM | POA: Insufficient documentation

## 2017-11-30 DIAGNOSIS — K219 Gastro-esophageal reflux disease without esophagitis: Secondary | ICD-10-CM | POA: Insufficient documentation

## 2017-11-30 NOTE — ED Triage Notes (Signed)
Pt c/o SOB and lateral chest pain that has gotten worse over the last two days, feels like everything that she eats sits at the top of her stomach

## 2017-12-01 ENCOUNTER — Encounter (HOSPITAL_BASED_OUTPATIENT_CLINIC_OR_DEPARTMENT_OTHER): Payer: Self-pay | Admitting: Emergency Medicine

## 2017-12-01 LAB — BASIC METABOLIC PANEL
ANION GAP: 9 (ref 5–15)
BUN: 10 mg/dL (ref 6–20)
CHLORIDE: 104 mmol/L (ref 101–111)
CO2: 23 mmol/L (ref 22–32)
Calcium: 8.9 mg/dL (ref 8.9–10.3)
Creatinine, Ser: 0.73 mg/dL (ref 0.44–1.00)
GFR calc Af Amer: >60 mL/min (ref >60)
GLUCOSE: 88 mg/dL (ref 65–99)
Potassium: 3.4 mmol/L — ABNORMAL LOW (ref 3.5–5.1)
Sodium: 136 mmol/L (ref 135–145)

## 2017-12-01 LAB — CBC
HEMATOCRIT: 28.4 % — AB (ref 36.0–46.0)
Hemoglobin: 8.1 g/dL — ABNORMAL LOW (ref 12.0–15.0)
MCH: 18.4 pg — AB (ref 26.0–34.0)
MCHC: 28.5 g/dL — AB (ref 30.0–36.0)
MCV: 64.5 fL — AB (ref 78.0–100.0)
Platelets: 378 10*3/uL (ref 150–400)
RBC: 4.4 MIL/uL (ref 3.87–5.11)
RDW: 17.2 % — ABNORMAL HIGH (ref 11.5–15.5)
WBC: 5.8 10*3/uL (ref 4.0–10.5)

## 2017-12-01 LAB — TROPONIN I
Troponin I: <0.03 ng/mL (ref <0.03)
Troponin I: <0.03 ng/mL (ref <0.03)

## 2017-12-01 MED ORDER — OMEPRAZOLE 20 MG PO CPDR: 20.0000 mg | DELAYED_RELEASE_CAPSULE | Freq: Every day | ORAL | 0 refills | Status: DC

## 2017-12-01 MED ORDER — FERROUS SULFATE 325 (65 FE) MG PO TABS: 325.0000 mg | ORAL_TABLET | Freq: Three times a day (TID) | ORAL | 0 refills | Status: DC

## 2017-12-01 MED ORDER — AMLODIPINE BESYLATE 5 MG PO TABS: 5.0000 mg | ORAL_TABLET | Freq: Every day | ORAL | 0 refills | Status: DC

## 2017-12-01 MED ORDER — GI COCKTAIL ~~LOC~~
30.0000 mL | Freq: Once | ORAL | Status: AC
  Administered 2017-12-01: 30 mL via ORAL
  Filled 2017-12-01: qty 30

## 2017-12-01 MED ORDER — AMLODIPINE BESYLATE 5 MG PO TABS
5.0000 mg | ORAL_TABLET | Freq: Once | ORAL | Status: AC
  Administered 2017-12-01: 5 mg via ORAL
  Filled 2017-12-01: qty 1

## 2017-12-01 NOTE — ED Provider Notes (Signed)
[facility] EMERGENCY DEPARTMENT Provider Note   CSN: [id number] Arrival date & time: 11/30/17  2224     History   Chief Complaint Chief Complaint  Patient presents with  . Chest Pain    HPI Paula Nelson is a 47 y.o. female.  The history is provided by the patient.  Chest Pain   This is a new problem. The current episode started more than 2 days ago. The problem occurs constantly. The problem has been rapidly worsening. The pain is associated with rest and eating. The pain is present in the lateral region. The quality of the pain is described as dull. The pain does not radiate. Pertinent negatives include no abdominal pain, no diaphoresis, no exertional chest pressure, no fever, no irregular heartbeat, no leg pain, no lower extremity edema, no nausea, no palpitations, no PND, no shortness of breath and no sputum production. She has tried nothing for the symptoms. Risk factors include obesity.  Pertinent negatives for past medical history include no MI and no PE.  Pertinent negatives for family medical history include: no aortic dissection.  Feels like food is getting stuck and not digesting.    Past Medical History:  Diagnosis Date  . Chest pain    a. 06/2015: ETT with EKG showing no acute changes.  . Hypertension   . Palpitations     Patient Active Problem List   Diagnosis Date Noted  . Other fatigue 04/12/2017  . Sleep apnea 04/12/2017  . Class 2 obesity with serious comorbidity and body mass index (BMI) of 35.0 to 35.9 in adult 11/07/2016  . Iron deficiency anemia 02/25/2014  . Precordial pain 08/13/2012  . Palpitations 08/13/2012  . HTN (hypertension) 08/13/2012    Past Surgical History:  Procedure Laterality Date  . CHOLECYSTECTOMY    . TUBAL LIGATION       OB History   None      Home Medications    Prior to Admission medications   Medication Sig Start Date End Date Taking? Authorizing Provider  amLODipine (NORVASC) 5 MG tablet Take 1  tablet (5 mg total) by mouth daily. 12/01/17 03/01/18  Ariza Evans, MD  benazepril (LOTENSIN) 20 MG tablet Take 1 tablet (20 mg total) by mouth 2 (two) times daily. 10/05/16   Rollene Rotunda, MD  chlorthalidone (HYGROTON) 25 MG tablet Take 1 tablet (25 mg total) by mouth daily. 02/21/17   Strader, Lennart Pall, PA-C  famotidine (PEPCID) 20 MG tablet Take 1 tablet (20 mg total) by mouth 2 (two) times daily. 08/07/16   Page, Nathan Italy, MD  ferrous sulfate 325 (65 FE) MG tablet Take 1 tablet (325 mg total) by mouth 3 (three) times daily with meals. 12/01/17   Sante Biedermann, MD  ibuprofen (ADVIL,MOTRIN) 600 MG tablet Take 1 tablet (600 mg total) by mouth every 6 (six) hours as needed for pain. 08/05/12   Loren Racer, MD  nitroGLYCERIN (NITROSTAT) 0.4 MG SL tablet Place 1 tablet (0.4 mg total) under the tongue every 5 (five) minutes as needed for chest pain. 02/21/17 05/22/17  Strader, Lennart Pall, PA-C  omeprazole (PRILOSEC) 20 MG capsule Take 1 capsule (20 mg total) by mouth daily. 12/01/17   Atalaya Zappia, MD  spironolactone (ALDACTONE) 50 MG tablet Take 1 tablet (50 mg total) by mouth daily. 01/09/17 04/09/17  Rollene Rotunda, MD    Family History Family History  Problem Relation Age of Onset  . Atrial fibrillation Father   . Hypertension Father   . Hypertension Mother  Social History Social History   Tobacco Use  . Smoking status: Never Smoker  . Smokeless tobacco: Never Used  Substance Use Topics  . Alcohol use: Yes  . Drug use: No     Allergies   Patient has no known allergies.   Review of Systems Review of Systems  Constitutional: Negative for diaphoresis and fever.  Respiratory: Negative for sputum production and shortness of breath.   Cardiovascular: Positive for chest pain. Negative for palpitations, leg swelling and PND.  Gastrointestinal: Negative for abdominal pain and nausea.  All other systems reviewed and are negative.    Physical Exam Updated Vital Signs BP  (!) 160/84   Pulse 70   Temp 98.2 F (36.8 C) (Oral)   Resp (!) 21   Ht 4\' 11"  (1.499 m)   Wt 79.4 kg (175 lb)   LMP 11/24/2017   SpO2 99%   BMI 35.35 kg/m   Physical Exam  Constitutional: She is oriented to person, place, and time. She appears well-developed and well-nourished. No distress.  HENT:  Head: Normocephalic and atraumatic.  Mouth/Throat: No oropharyngeal exudate.  Eyes: Pupils are equal, round, and reactive to light. Conjunctivae are normal.  Neck: Normal range of motion. Neck supple.  Cardiovascular: Normal rate, regular rhythm, normal heart sounds and intact distal pulses.  Pulmonary/Chest: Effort normal and breath sounds normal. No stridor. She has no wheezes. She has no rales.  Abdominal: Soft. Bowel sounds are normal. She exhibits no mass. There is no tenderness. There is no rebound and no guarding.  Musculoskeletal: Normal range of motion. She exhibits no edema or tenderness.  Neurological: She is alert and oriented to person, place, and time. She displays normal reflexes.  Skin: Skin is warm and dry. Capillary refill takes less than 2 seconds.  Psychiatric: She has a normal mood and affect.     ED Treatments / Results  Labs (all labs ordered are listed, but only abnormal results are displayed) Labs Reviewed  BASIC METABOLIC PANEL - Abnormal; Notable for the following components:      Result Value   Potassium 3.4 (*)    All other components within normal limits  CBC - Abnormal; Notable for the following components:   Hemoglobin 8.1 (*)    HCT 28.4 (*)    MCV 64.5 (*)    MCH 18.4 (*)    MCHC 28.5 (*)    RDW 17.2 (*)    All other components within normal limits  TROPONIN I  TROPONIN I    EKG EKG Interpretation  Date/Time:  Thursday Nov 30 2017 22:31:15 EDT Ventricular Rate:  84 PR Interval:  140 QRS Duration: 82 QT Interval:  358 QTC Calculation: 423 R Axis:   58 Text Interpretation:  Normal sinus rhythm Nonspecific T wave abnormality  Abnormal ECG No STEMI.  Confirmed by Alona BeneLong, Joshua [phone]([id number]) on 11/30/2017 11:43:21 PM   Radiology Dg Chest 2 View  Result Date: 11/30/2017 CLINICAL DATA:  Acute onset of left upper chest pain. Chronic cough. Tachycardia. EXAM: CHEST - 2 VIEW COMPARISON:  Chest radiograph performed 08/07/2016 FINDINGS: The lungs are well-aerated and clear. There is no evidence of focal opacification, pleural effusion or pneumothorax. The heart is normal in size; the mediastinal contour is within normal limits. No acute osseous abnormalities are seen. Clips are noted within the right upper quadrant, reflecting prior cholecystectomy. IMPRESSION: No acute cardiopulmonary process seen. Electronically Signed   By: Roanna RaiderJeffery  Chang M.D.   On: 11/30/2017 22:53    Procedures Procedures (  including critical care time)  Medications Ordered in ED Medications  gi cocktail (Maalox,Lidocaine,Donnatal) (30 mLs Oral Given 12/01/17 0122)  amLODipine (NORVASC) tablet 5 mg (5 mg Oral Given 12/01/17 0117)      Final Clinical Impressions(s) / ED Diagnoses   Final diagnoses:  Other iron deficiency anemia  Gastroesophageal reflux disease without esophagitis  Return for weakness, numbness, changes in vision or speech, fevers >100.4 unrelieved by medication, shortness of breath, intractable vomiting, or diarrhea, abdominal pain, Inability to tolerate liquids or food, cough, altered mental status or any concerns. No signs of systemic illness or infection. The patient is nontoxic-appearing on exam and vital signs are within normal limits.   I have reviewed the triage vital signs and the nursing notes. Pertinent labs &imaging results that were available during my care of the patient were reviewed by me and considered in my medical decision making (see chart for details).  After history, exam, and medical workup I feel the patient has been appropriately medically screened and is safe for discharge home. Pertinent diagnoses were  discussed with the patient. Patient was given return precautions.    ED Discharge Orders        Ordered    amLODipine (NORVASC) 5 MG tablet  Daily     12/01/17 0113    ferrous sulfate 325 (65 FE) MG tablet  3 times daily with meals     12/01/17 0113    omeprazole (PRILOSEC) 20 MG capsule  Daily     12/01/17 0113       Mitzy Naron, MD 12/01/17 [phone]

## 2017-12-01 NOTE — ED Notes (Signed)
Signature pad not working at [employer] time. DC instructions and prescriptions given to the pt; pt verbalized understanding. Pt walked to the lobby with steady gait and stable.

## 2017-12-01 NOTE — ED Notes (Signed)
Per lab; they have received blood and tests are in process.

## 2019-01-02 ENCOUNTER — Telehealth: Payer: Self-pay | Admitting: *Deleted

## 2019-01-02 NOTE — Telephone Encounter (Signed)
A message was left, re: follow up visit. 

## 2019-08-01 ENCOUNTER — Encounter: Payer: Self-pay | Admitting: Cardiology

## 2019-08-08 NOTE — Progress Notes (Signed)
Cardiology Office Note   Date:  08/09/2019   ID:  Kriti Katayama, DOB October 27, 1970, MRN [id number]  PCP:  Donald Prose, MD  Cardiologist:   No primary care provider on file.   Chief Complaint  Patient presents with  . Hypertension      History of Present Illness: Paula Nelson is a 49 y.o. female who presents for evaluation of difficult to control HTN.  She was seen in the office in 2018.  She has been out of her medications for probably over a year.  She is working now has noticed recently when she has been taking her blood pressure that it has been consistently elevated in the 174/115 range.  She has no new cardiovascular symptoms.  She has had some chronic dyspnea with exertion such as climbing stairs and a little bit of chest burning but this is unchanged from 2018 when she had a negative Lexiscan Myoview.  She will occasionally feel her heart racing sometimes at rest but this seems to be sporadic and short-lived.  She tries to walk 2 times per week and she cannot bring on necessarily any chest discomfort excessive shortness of breath or rapid heart rate.  She said she used to tolerate the medication she just ran out.  She has not seen her primary in quite a while.   Past Medical History:  Diagnosis Date  . Chest pain    a. 06/2015: ETT with EKG showing no acute changes.  . Hypertension   . Palpitations     Past Surgical History:  Procedure Laterality Date  . CHOLECYSTECTOMY    . TUBAL LIGATION       Current Outpatient Medications  Medication Sig Dispense Refill  . amLODipine (NORVASC) 5 MG tablet Take 1 tablet (5 mg total) by mouth daily. 90 tablet 3  . benazepril (LOTENSIN) 20 MG tablet Take 1 tablet (20 mg total) by mouth 2 (two) times daily. 180 tablet 3  . chlorthalidone (HYGROTON) 25 MG tablet Take 1 tablet (25 mg total) by mouth daily. 90 tablet 3  . famotidine (PEPCID) 20 MG tablet Take 1 tablet (20 mg total) by mouth 2 (two) times daily. (Patient not taking:  Reported on 08/09/2019) 30 tablet 0  . ferrous sulfate 325 (65 FE) MG tablet Take 1 tablet (325 mg total) by mouth 3 (three) times daily with meals. (Patient not taking: Reported on 08/09/2019) 90 tablet 0  . ibuprofen (ADVIL,MOTRIN) 600 MG tablet Take 1 tablet (600 mg total) by mouth every 6 (six) hours as needed for pain. (Patient not taking: Reported on 08/09/2019) 30 tablet 0  . nitroGLYCERIN (NITROSTAT) 0.4 MG SL tablet Place 1 tablet (0.4 mg total) under the tongue every 5 (five) minutes as needed for chest pain. 90 tablet 3  . omeprazole (PRILOSEC) 20 MG capsule Take 1 capsule (20 mg total) by mouth daily. (Patient not taking: Reported on 08/09/2019) 30 capsule 0  . spironolactone (ALDACTONE) 50 MG tablet Take 1 tablet (50 mg total) by mouth daily. 90 tablet 3   No current facility-administered medications for this visit.    Allergies:   Patient has no known allergies.    ROS:  Please see the history of present illness.   Otherwise, review of systems are positive for none.   All other systems are reviewed and negative.    PHYSICAL EXAM: VS:  BP (!) 164/90   Pulse 81   Ht 4\' 11"  (1.499 m)   Wt 171 lb (77.6 kg)  SpO2 100%   BMI 34.54 kg/m  , BMI Body mass index is 34.54 kg/m. GENERAL:  Well appearing NECK:  No jugular venous distention, waveform within normal limits, carotid upstroke brisk and symmetric, no bruits, no thyromegaly LUNGS:  Clear to auscultation bilaterally CHEST:  Unremarkable HEART:  PMI not displaced or sustained,S1 and S2 within normal limits, no S3, no S4, no clicks, no rubs, no murmurs ABD:  Flat, positive bowel sounds normal in frequency in pitch, no bruits, no rebound, no guarding, no midline pulsatile mass, no hepatomegaly, no splenomegaly EXT:  2 plus pulses throughout, no edema, no cyanosis no clubbing   EKG:  EKG is ordered today. The ekg ordered today demonstrates sinus rhythm, rate 81, axis within normal limits, intervals within normal limits, no acute  ST-T wave changes.   Recent Labs: No results found for requested labs within last 8760 hours.    Lipid Panel No results found for: CHOL, TRIG, HDL, CHOLHDL, VLDL, LDLCALC, LDLDIRECT    Wt Readings from Last 3 Encounters:  08/09/19 171 lb (77.6 kg)  11/30/17 175 lb (79.4 kg)  05/30/17 176 lb (79.8 kg)      Other studies Reviewed: Additional studies/ records that were reviewed today include: Labs. Review of the above records demonstrates:  Please see elsewhere in the note.     ASSESSMENT AND PLAN:  Hypertension: The patient did do well on the previous blood pressure regimen.  I am going to go ahead and restart this understanding that this is going to be restarting for medications.  She is to get a blood pressure diary.  She is to come back in 10 days for blood work to include basic metabolic profile and TSH.   Chest pain: She had a negative Lexiscan Myoview. In 2018.  Her symptoms are unchanged.  No change in therapy.   Fatigue: She had mild sleep apnea in the past .  I reviewed this with her because she states she never got a result from her last sleep study.  It was only mild and she does not think her fatigue is significant so no change in therapy.  Anemia: I do note that in 2018 she had a significant anemia and I do not know that this was ever worked up but I do not have those records.  She does not remember being told about this.  He has not had this repeated in several years because she has not followed with her primary.  I will check a CBC when she returns.  Covid education: She would like to get the vaccine when able.   Current medicines are reviewed at length with the patient today.  The patient does not have concerns regarding medicines.  The following changes have been made:  As above  Labs/ tests ordered today include:   Orders Placed This Encounter  Procedures  . Basic metabolic panel  . CBC  . TSH  . EKG 12-Lead     Disposition:   FU with APP in  one month.     Signed, Rollene Rotunda, MD  08/09/2019 5:25 PM    [facility]

## 2019-08-09 ENCOUNTER — Encounter: Payer: Self-pay | Admitting: Cardiology

## 2019-08-09 ENCOUNTER — Ambulatory Visit (INDEPENDENT_AMBULATORY_CARE_PROVIDER_SITE_OTHER): Payer: BC Managed Care – PPO | Admitting: Cardiology

## 2019-08-09 ENCOUNTER — Other Ambulatory Visit: Payer: Self-pay

## 2019-08-09 VITALS — BP 164/90 | HR 81 | Ht 59.0 in | Wt 171.0 lb

## 2019-08-09 DIAGNOSIS — R072 Precordial pain: Secondary | ICD-10-CM

## 2019-08-09 DIAGNOSIS — R0602 Shortness of breath: Secondary | ICD-10-CM | POA: Diagnosis not present

## 2019-08-09 DIAGNOSIS — I10 Essential (primary) hypertension: Secondary | ICD-10-CM

## 2019-08-09 MED ORDER — AMLODIPINE BESYLATE 5 MG PO TABS: 5.0000 mg | ORAL_TABLET | Freq: Every day | ORAL | 3 refills | Status: DC

## 2019-08-09 MED ORDER — SPIRONOLACTONE 50 MG PO TABS: 50.0000 mg | ORAL_TABLET | Freq: Every day | ORAL | 3 refills | Status: AC

## 2019-08-09 MED ORDER — BENAZEPRIL HCL 20 MG PO TABS: 20.0000 mg | ORAL_TABLET | Freq: Two times a day (BID) | ORAL | 3 refills | Status: AC

## 2019-08-09 MED ORDER — AMLODIPINE BESYLATE 5 MG PO TABS: 5.0000 mg | ORAL_TABLET | Freq: Every day | ORAL | 3 refills | Status: AC

## 2019-08-09 MED ORDER — CHLORTHALIDONE 25 MG PO TABS: 25.0000 mg | ORAL_TABLET | Freq: Every day | ORAL | 3 refills | Status: AC

## 2019-08-09 NOTE — Patient Instructions (Signed)
Medication Instructions:  Cardiac medications refilled *If you need a refill on your cardiac medications before your next appointment, please call your pharmacy*  Lab Work: Your physician recommends that you return for lab work in 10 days (CBC, TSH, BMP) If you have labs (blood work) drawn today and your tests are completely normal, you will receive your results only by: Marland Kitchen MyChart Message (if you have MyChart) OR . A paper copy in the mail If you have any lab test that is abnormal or we need to change your treatment, we will call you to review the results.  Testing/Procedures: None  Follow-Up: At [facility], you and your health needs are our priority.  As part of our continuing mission to provide you with exceptional heart care, we have created designated Provider Care Teams.  These Care Teams include your primary Cardiologist (physician) and Advanced Practice Providers (APPs -  Physician Assistants and Nurse Practitioners) who all work together to provide you with the care you need, when you need it.  Your next appointment:   1 month(s)  The format for your next appointment:   In Person  Provider:   You may see  one of the following Advanced Practice Providers on your designated Care Team:    Theodore Demark, PA-C  Joni Reining, DNP, ANP  Cadence Fransico Michael, NP  Azalee Course, PA

## 2019-08-13 ENCOUNTER — Telehealth: Payer: Self-pay | Admitting: Cardiology

## 2019-08-13 NOTE — Telephone Encounter (Signed)
Per staff message from [place], called patient to schedule 1 month  Follow up with w/ [facility] APP

## 2019-08-20 LAB — BASIC METABOLIC PANEL
BUN/Creatinine Ratio: 14 (ref 9–23)
BUN: 16 mg/dL (ref 6–24)
CO2: 22 mmol/L (ref 20–29)
Calcium: 10.2 mg/dL (ref 8.7–10.2)
Chloride: 99 mmol/L (ref 96–106)
Creatinine, Ser: 1.11 mg/dL — ABNORMAL HIGH (ref 0.57–1.00)
GFR calc Af Amer: 69 mL/min/{1.73_m2} (ref >59)
GFR calc non Af Amer: 60 mL/min/{1.73_m2} (ref >59)
Glucose: 65 mg/dL (ref 65–99)
Potassium: 4.8 mmol/L (ref 3.5–5.2)
Sodium: 137 mmol/L (ref 134–144)

## 2019-08-20 LAB — CBC
Hematocrit: 32.5 % — ABNORMAL LOW (ref 34.0–46.6)
Hemoglobin: 8.7 g/dL — ABNORMAL LOW (ref 11.1–15.9)
MCH: 17 pg — ABNORMAL LOW (ref 26.6–33.0)
MCHC: 26.8 g/dL — ABNORMAL LOW (ref 31.5–35.7)
MCV: 64 fL — ABNORMAL LOW (ref 79–97)
Platelets: 316 10*3/uL (ref 150–450)
RBC: 5.11 x10E6/uL (ref 3.77–5.28)
RDW: 17.5 % — ABNORMAL HIGH (ref 11.7–15.4)
WBC: 6.9 10*3/uL (ref 3.4–10.8)

## 2019-08-20 LAB — TSH: TSH: 0.831 u[IU]/mL (ref 0.450–4.500)

## 2019-09-01 NOTE — Progress Notes (Addendum)
Cardiology Office Note  Date: 09/03/2019   ID: Paula Nelson, DOB 08/19/1970, MRN [id number]  PCP:  Deatra James, MD  Cardiologist:  Rollene Rotunda, MD  Electrophysiologist:  None   Chief Complaint  Patient presents with  . Follow-up    HTN. CP.  . Chest Pain    When walking.    History of Present Illness: Paula Nelson is a 49 y.o. female with a history of HTN, CP, palpitations, IDA.  Seen by Dr. Antoine Poche on 08/09/2019.  She had been without her blood pressure for approximately 1 year.  She had recently taken her blood pressure and noticed it had been consistently elevated in the 174/115 range.  She was having no other symptoms other than some chronic dyspnea with exertion when climbing stairs.  Also admitted to some chest burning unchanged from 2018.  She had a Lexiscan Myoview that was negative in 2018.  She complained occasionally of heart racing symptoms at rest.  She stated these episodes were sporadic and short-lived.  She was walking at least 2 times a week without any significant progressive anginal or exertional symptoms.  Blood pressure is well controlled today at 130/80.  Patient has been complaining of exertional chest pain without radiation or associated nausea, vomiting, diaphoresis, dizziness.  States the chest pain is predictable on activity when ambulating or performing normal daily activities.  States the chest pain is relieved with rest.  Her last nuclear stress test was August 2018 which was negative.  She admits she has a first-degree relative in her father who had early coronary artery disease with stent placement.  She denies any recent acute illnesses, hospitalizations, surgeries or pending surgeries, or travels.  Denies any Covid-like symptoms.  She plans to receive the vaccine as soon as it is available.  She denies any PND or orthopnea but states she has to sleep on a couple pillows to breathe better.  She states she has had a sleep study in the past and held  mild OSA which did not require CPAP.   Past Medical History:  Diagnosis Date  . Chest pain    a. 06/2015: ETT with EKG showing no acute changes.  . Hypertension   . Palpitations     Past Surgical History:  Procedure Laterality Date  . CHOLECYSTECTOMY    . TUBAL LIGATION      Current Outpatient Medications  Medication Sig Dispense Refill  . amLODipine (NORVASC) 5 MG tablet Take 1 tablet (5 mg total) by mouth daily. 90 tablet 3  . benazepril (LOTENSIN) 20 MG tablet Take 1 tablet (20 mg total) by mouth 2 (two) times daily. 180 tablet 3  . chlorthalidone (HYGROTON) 25 MG tablet Take 1 tablet (25 mg total) by mouth daily. 90 tablet 3  . famotidine (PEPCID) 20 MG tablet Take 1 tablet (20 mg total) by mouth 2 (two) times daily. (Patient taking differently: Take 20 mg by mouth daily. ) 30 tablet 0  . ibuprofen (ADVIL,MOTRIN) 600 MG tablet Take 1 tablet (600 mg total) by mouth every 6 (six) hours as needed for pain. 30 tablet 0  . nitroGLYCERIN (NITROSTAT) 0.4 MG SL tablet Place 1 tablet (0.4 mg total) under the tongue every 5 (five) minutes as needed for chest pain. 25 tablet 1  . spironolactone (ALDACTONE) 50 MG tablet Take 1 tablet (50 mg total) by mouth daily. 90 tablet 3  . metoprolol tartrate (LOPRESSOR) 100 MG tablet Take 1 tablet (100 mg total) by mouth once for 1 dose.  1 tablet 0   No current facility-administered medications for this visit.   Allergies:  Patient has no known allergies.   Social History: The patient  reports that she has never smoked. She has never used smokeless tobacco. She reports current alcohol use. She reports that she does not use drugs.   Family History: The patient's family history includes Atrial fibrillation in her father; Hypertension in her father and mother.   ROS:  Please see the history of present illness. Otherwise, complete review of systems is positive for none.  All other systems are reviewed and negative.   Physical Exam: VS:  BP 130/80 (BP  Location: Left Arm, Patient Position: Sitting, Cuff Size: Normal)   Pulse 98   Ht 4\' 11"  (1.499 m)   Wt 174 lb (78.9 kg)   BMI 35.14 kg/m , BMI Body mass index is 35.14 kg/m.  Wt Readings from Last 3 Encounters:  09/02/19 174 lb (78.9 kg)  08/09/19 171 lb (77.6 kg)  11/30/17 175 lb (79.4 kg)    General: Patient appears comfortable at rest. Neck: Supple, no elevated JVP or carotid bruits, no thyromegaly. Lungs: Clear to auscultation, nonlabored breathing at rest. Cardiac: Regular rate and rhythm, no S3 or significant systolic murmur, no pericardial rub. Extremities: No pitting edema, distal pulses 2+. Skin: Warm and dry. Musculoskeletal: No kyphosis. Neuropsychiatric: Alert and oriented x3, affect grossly appropriate.  ECG:  An ECG dated 09/02/2019. was personally reviewed today and demonstrated:  Normal sinus rhythm rate of 98, nonspecific T wave abnormality, prolonged QT  Recent Labwork: 08/19/2019: BUN 16; Creatinine, Ser 1.11; Hemoglobin 8.7; Platelets 316; Potassium 4.8; Sodium 137; TSH 0.831  No results found for: CHOL, TRIG, HDL, CHOLHDL, VLDL, LDLCALC, LDLDIRECT  Other Studies Reviewed Today: Lexiscan Myoview 02/14/2017  Study Highlights    Nuclear stress EF: 64%.  The left ventricular ejection fraction is normal (55-65%).  There was no ST segment deviation noted during stress.  The study is normal.  This is a low risk study.   Normal pharmacologic nuclear stress test with no evidence for prior infarct or ischemia     Assessment and Plan:  1. Essential hypertension   2. Precordial pain   3. Other fatigue   4. Iron deficiency anemia, unspecified iron deficiency anemia type    1. Essential hypertension Blood pressure is much better during this visit.  Today's blood pressure 130/80.  Continue Amlodipine 5 mg, benazepril 20 mg p.o. twice daily, chlorthalidone 25 mg daily.  2. Precordial pain She is complaining of exertional chest pain/chest pressure that  is predictable on exertion and relieved with rest.  She denies any radiation or associated nausea, vomiting, diaphoresis, or dizziness.  Last Lexiscan Myoview stress test was negative in August 2018.  She has a first-degree relative in her father who had early heart disease with stent placement in his early 1s.  Start nitroglycerin sublingual as needed for chest pain.  Get coronary CT angiogram with FFR if warranted secondary to complaints of chest pain.  Get BMET and magnesium.  EKG today shows normal sinus rhythm, nonspecific T wave abnormality, QT/QTc intervals 370/472 ms.  Patient had a recent elevated creatinine of 1.11.  Need to recheck prior to CT angiogram.  3. Other fatigue History of complaints of fatigue.  However she denies any recent complaints.  Has a history of iron deficiency anemia/morbid obesity  4. Iron deficiency anemia, unspecified iron deficiency anemia type History of iron deficiency anemia.  Last hemoglobin and hematocrit were 8.7  and 32.5.  She has ferrous sulfate 325 mg to be taken daily.  She admits she has not taken the medication as directed but will start back on a scheduled dosage to correct her anemia.  Medication Adjustments/Labs and Tests Ordered: Current medicines are reviewed at length with the patient today.  Concerns regarding medicines are outlined above.   Disposition ; Dr Percival Spanish  Signed, Levell July, NP 09/03/2019 8:39 PM    [facility]

## 2019-09-02 ENCOUNTER — Other Ambulatory Visit: Payer: Self-pay

## 2019-09-02 ENCOUNTER — Encounter: Payer: Self-pay | Admitting: Adult Health

## 2019-09-02 ENCOUNTER — Ambulatory Visit (INDEPENDENT_AMBULATORY_CARE_PROVIDER_SITE_OTHER): Payer: BC Managed Care – PPO | Admitting: Adult Health

## 2019-09-02 VITALS — BP 130/80 | HR 98 | Ht 59.0 in | Wt 174.0 lb

## 2019-09-02 DIAGNOSIS — R5383 Other fatigue: Secondary | ICD-10-CM | POA: Diagnosis not present

## 2019-09-02 DIAGNOSIS — R072 Precordial pain: Secondary | ICD-10-CM | POA: Diagnosis not present

## 2019-09-02 DIAGNOSIS — I10 Essential (primary) hypertension: Secondary | ICD-10-CM | POA: Diagnosis not present

## 2019-09-02 DIAGNOSIS — D509 Iron deficiency anemia, unspecified: Secondary | ICD-10-CM

## 2019-09-02 MED ORDER — METOPROLOL TARTRATE 100 MG PO TABS: 100.0000 mg | ORAL_TABLET | Freq: Once | ORAL | 0 refills | Status: AC

## 2019-09-02 MED ORDER — NITROGLYCERIN 0.4 MG SL SUBL: 0.4000 mg | SUBLINGUAL_TABLET | SUBLINGUAL | 1 refills | Status: AC | PRN

## 2019-09-02 NOTE — Patient Instructions (Addendum)
Medication Instructions:  TAKE- Metoprolol Tartrate 100 mg by mouth 2 hour prior to procedure  *If you need a refill on your cardiac medications before your next appointment, please call your pharmacy*   Lab Work: [facility]  If you have labs (blood work) drawn today and your tests are completely normal, you will receive your results only by: Marland Kitchen MyChart Message (if you have MyChart) OR . A paper copy in the mail If you have any lab test that is abnormal or we need to change your treatment, we will call you to review the results.   Testing/Procedures: Non-Cardiac CT Angiography (CTA), is a special type of CT scan that uses a computer to produce multi-dimensional views of major blood vessels throughout the body. In CT angiography, a contrast material is injected through an IV to help visualize the blood vessels  Follow-Up: At [facility], you and your health needs are our priority.  As part of our continuing mission to provide you with exceptional heart care, we have created designated Provider Care Teams.  These Care Teams include your primary Cardiologist (physician) and Advanced Practice Providers (APPs -  Physician Assistants and Nurse Practitioners) who all work together to provide you with the care you need, when you need it.  We recommend signing up for the patient portal called "MyChart".  Sign up information is provided on this After Visit Summary.  MyChart is used to connect with patients for Virtual Visits (Telemedicine).  Patients are able to view lab/test results, encounter notes, upcoming appointments, etc.  Non-urgent messages can be sent to your provider as well.   To learn more about what you can do with MyChart, go to [email or web address].    Your next appointment:   6 week(s)  The format for your next appointment:   In Person  Provider:   You may see Minus Breeding, MD or one of the following Advanced Practice Providers on your designated Care Team:    Rosaria Ferries,  PA-C  Jory Sims, DNP, ANP  Cadence Kathlen Mody, NP    Other Instructions Your cardiac CT will be scheduled at one of the below locations:   [facility] [address] [place], [place] [address] [phone]  [place] [address] [place], [place] [address] [phone]  If scheduled at [facility], please arrive at the [facility] main entrance of [facility] 30 minutes prior to test start time. Proceed to the [facility] Department (first floor) to check-in and test prep.  If scheduled at [facility], please arrive 15 mins early for check-in and test prep.  Please follow these instructions carefully (unless otherwise directed):  Hold all erectile dysfunction medications at least 3 days (72 hrs) prior to test.  On the Night Before the Test: . Be sure to Drink plenty of water. . Do not consume any caffeinated/decaffeinated beverages or chocolate 12 hours prior to your test. . Do not take any antihistamines 12 hours prior to your test. . If you take Metformin do not take 24 hours prior to test.   On the Day of the Test: . Drink plenty of water. Do not drink any water within one hour of the test. . Do not eat any food 4 hours prior to the test. . You may take your regular medications prior to the test.  . Take metoprolol (Lopressor) two hours prior to test. . HOLD Furosemide/Hydrochlorothiazide morning of the test. .  FEMALES- please wear underwire-free bra if available   After the Test: . Drink plenty of water. . After receiving IV contrast, you may experience a mild flushed feeling. This is normal. . On occasion, you may experience a mild rash up to 24 hours after the test. This is not dangerous. If this occurs, you can take Benadryl 25 mg and increase your fluid intake. . If you experience trouble breathing, this can be serious. If it is severe  call 911 IMMEDIATELY. If it is mild, please call our office. . If you take any of these medications: Glipizide/Metformin, Avandament, Glucavance, please do not take 48 hours after completing test unless otherwise instructed.   Once we have confirmed authorization from your insurance company, we will call you to set up a date and time for your test.   For non-scheduling related questions, please contact the cardiac imaging nurse navigator should you have any questions/concerns: Rockwell Alexandria, RN Navigator Cardiac Imaging [facility] [phone] mobile

## 2019-09-19 ENCOUNTER — Telehealth: Payer: Self-pay | Admitting: *Deleted

## 2019-09-19 NOTE — Telephone Encounter (Signed)
Leave message for pt to call back to schedule covid test for pt CTA on April 5th

## 2019-10-04 ENCOUNTER — Telehealth (HOSPITAL_COMMUNITY): Payer: Self-pay | Admitting: Emergency Medicine

## 2019-10-04 NOTE — Telephone Encounter (Signed)
Attempted to call patient regarding upcoming cardiac CT appointment. °Left message on voicemail with name and callback number °Skyylar Kopf RN Navigator Cardiac Imaging °[facility] °[phone] Office °[phone] Cell ° °

## 2019-10-07 ENCOUNTER — Ambulatory Visit (HOSPITAL_COMMUNITY)
Admission: RE | Admit: 2019-10-07 | Discharge: 2019-10-07 | Disposition: A | Discharge status: Home / Self Care | Payer: BC Managed Care – PPO | Source: Ambulatory Visit | Attending: Adult Health | Admitting: Adult Health

## 2019-10-07 ENCOUNTER — Encounter: Payer: BC Managed Care – PPO | Admitting: *Deleted

## 2019-10-07 ENCOUNTER — Encounter (HOSPITAL_COMMUNITY): Payer: Self-pay

## 2019-10-07 ENCOUNTER — Other Ambulatory Visit: Payer: Self-pay

## 2019-10-07 DIAGNOSIS — Z006 Encounter for examination for normal comparison and control in clinical research program: Secondary | ICD-10-CM

## 2019-10-07 DIAGNOSIS — R072 Precordial pain: Secondary | ICD-10-CM | POA: Diagnosis not present

## 2019-10-07 LAB — POCT I-STAT CREATININE: Creatinine, Ser: 0.9 mg/dL (ref 0.44–1.00)

## 2019-10-07 MED ORDER — IOHEXOL 350 MG/ML SOLN
80.0000 mL | Freq: Once | INTRAVENOUS | Status: AC | PRN
  Administered 2019-10-07: 80 mL via INTRAVENOUS

## 2019-10-07 MED ORDER — METOPROLOL TARTRATE 5 MG/5ML IV SOLN: 5.0000 mg | INTRAVENOUS | Status: DC | PRN

## 2019-10-07 MED ORDER — NITROGLYCERIN 0.4 MG SL SUBL: 0.8000 mg | SUBLINGUAL_TABLET | Freq: Once | SUBLINGUAL | Status: AC

## 2019-10-07 MED ORDER — NITROGLYCERIN 0.4 MG SL SUBL
SUBLINGUAL_TABLET | SUBLINGUAL | Status: AC
  Administered 2019-10-07: 0.8 mg via SUBLINGUAL
  Filled 2019-10-07: qty 2

## 2019-10-07 MED ORDER — METOPROLOL TARTRATE 5 MG/5ML IV SOLN
INTRAVENOUS | Status: AC
  Filled 2019-10-07: qty 5

## 2019-10-07 NOTE — Research (Signed)
CADFEM Informed Consent                  Subject Name:   Paula Nelson   Subject met inclusion and exclusion criteria.  The informed consent form, study requirements and expectations were reviewed with the subject and questions and concerns were addressed prior to the signing of the consent form.  The subject verbalized understanding of the trial requirements.  The subject agreed to participate in the CADFEM trial and signed the informed consent.  The informed consent was obtained prior to performance of any protocol-specific procedures for the subject.  A copy of the signed informed consent was given to the subject and a copy was placed in the subject's medical record.   [place] Deangleo Passage, Research Assistant  10/07/2019 15:01 p.m.

## 2019-10-14 ENCOUNTER — Ambulatory Visit: Payer: BC Managed Care – PPO | Admitting: Cardiology

## 2019-11-04 DIAGNOSIS — Z7189 Other specified counseling: Secondary | ICD-10-CM | POA: Insufficient documentation

## 2019-11-04 NOTE — Progress Notes (Deleted)
Cardiology Office Note   Date:  11/04/2019   ID:  Paula Nelson, DOB 27-Aug-1970, MRN [id number]  PCP:  Donald Prose, MD  Cardiologist:   Minus Breeding, MD   No chief complaint on file.     History of Present Illness: Paula Nelson is a 49 y.o. female who presents for evaluation of difficult to control HTN.  At the last visit she had chest pain and had a CT coronary angiogram without any CAD.  ***    She was seen in the office in 2018.  She has been out of her medications for probably over a year.  She is working now has noticed recently when she has been taking her blood pressure that it has been consistently elevated in the 174/115 range.  She has no new cardiovascular symptoms.  She has had some chronic dyspnea with exertion such as climbing stairs and a little bit of chest burning but this is unchanged from 2018 when she had a negative Lexiscan Myoview.  She will occasionally feel her heart racing sometimes at rest but this seems to be sporadic and short-lived.  She tries to walk 2 times per week and she cannot bring on necessarily any chest discomfort excessive shortness of breath or rapid heart rate.  She said she used to tolerate the medication she just ran out.  She has not seen her primary in quite a while.   Past Medical History:  Diagnosis Date  . Chest pain    a. 06/2015: ETT with EKG showing no acute changes.  . Hypertension   . Palpitations     Past Surgical History:  Procedure Laterality Date  . CHOLECYSTECTOMY    . TUBAL LIGATION       Current Outpatient Medications  Medication Sig Dispense Refill  . amLODipine (NORVASC) 5 MG tablet Take 1 tablet (5 mg total) by mouth daily. 90 tablet 3  . benazepril (LOTENSIN) 20 MG tablet Take 1 tablet (20 mg total) by mouth 2 (two) times daily. 180 tablet 3  . chlorthalidone (HYGROTON) 25 MG tablet Take 1 tablet (25 mg total) by mouth daily. 90 tablet 3  . famotidine (PEPCID) 20 MG tablet Take 1 tablet (20 mg total) by  mouth 2 (two) times daily. (Patient taking differently: Take 20 mg by mouth daily. ) 30 tablet 0  . ibuprofen (ADVIL,MOTRIN) 600 MG tablet Take 1 tablet (600 mg total) by mouth every 6 (six) hours as needed for pain. 30 tablet 0  . metoprolol tartrate (LOPRESSOR) 100 MG tablet Take 1 tablet (100 mg total) by mouth once for 1 dose. 1 tablet 0  . nitroGLYCERIN (NITROSTAT) 0.4 MG SL tablet Place 1 tablet (0.4 mg total) under the tongue every 5 (five) minutes as needed for chest pain. 25 tablet 1  . spironolactone (ALDACTONE) 50 MG tablet Take 1 tablet (50 mg total) by mouth daily. 90 tablet 3   No current facility-administered medications for this visit.    Allergies:   Patient has no known allergies.    ROS:  Please see the history of present illness.   Otherwise, review of systems are positive for ***.   All other systems are reviewed and negative.    PHYSICAL EXAM: VS:  There were no vitals taken for this visit. , BMI There is no height or weight on file to calculate BMI. GENERAL:  Well appearing NECK:  No jugular venous distention, waveform within normal limits, carotid upstroke brisk and symmetric, no bruits, no thyromegaly  LUNGS:  Clear to auscultation bilaterally CHEST:  Unremarkable HEART:  PMI not displaced or sustained,S1 and S2 within normal limits, no S3, no S4, no clicks, no rubs, *** murmurs ABD:  Flat, positive bowel sounds normal in frequency in pitch, no bruits, no rebound, no guarding, no midline pulsatile mass, no hepatomegaly, no splenomegaly EXT:  2 plus pulses throughout, no edema, no cyanosis no clubbing    ***GENERAL:  Well appearing NECK:  No jugular venous distention, waveform within normal limits, carotid upstroke brisk and symmetric, no bruits, no thyromegaly LUNGS:  Clear to auscultation bilaterally CHEST:  Unremarkable HEART:  PMI not displaced or sustained,S1 and S2 within normal limits, no S3, no S4, no clicks, no rubs, no murmurs ABD:  Flat, positive  bowel sounds normal in frequency in pitch, no bruits, no rebound, no guarding, no midline pulsatile mass, no hepatomegaly, no splenomegaly EXT:  2 plus pulses throughout, no edema, no cyanosis no clubbing   EKG:  EKG is *** ordered today. The ekg ordered today demonstrates sinus rhythm, rate ***, axis within normal limits, intervals within normal limits, no acute ST-T wave changes.   Recent Labs: 08/19/2019: BUN 16; Hemoglobin 8.7; Platelets 316; Potassium 4.8; Sodium 137; TSH 0.831 10/07/2019: Creatinine, Ser 0.90    Lipid Panel No results found for: CHOL, TRIG, HDL, CHOLHDL, VLDL, LDLCALC, LDLDIRECT    Wt Readings from Last 3 Encounters:  09/02/19 174 lb (78.9 kg)  08/09/19 171 lb (77.6 kg)  11/30/17 175 lb (79.4 kg)      Other studies Reviewed: Additional studies/ records that were reviewed today include: ***. Review of the above records demonstrates:  Please see elsewhere in the note.     ASSESSMENT AND PLAN:  Hypertension: ***  he patient did do well on the previous blood pressure regimen.  I am going to go ahead and restart this understanding that this is going to be restarting for medications.  She is to get a blood pressure diary.  She is to come back in 10 days for blood work to include basic metabolic profile and TSH.   Chest pain: ***  She had a negative Lexiscan Myoview. In 2018.  Her symptoms are unchanged.  No change in therapy.   Fatigue: ***  She had mild sleep apnea in the past .  I reviewed this with her because she states she never got a result from her last sleep study.  It was only mild and she does not think her fatigue is significant so no change in therapy.  Anemia:  *** I do note that in 2018 she had a significant anemia and I do not know that this was ever worked up but I do not have those records.  She does not remember being told about this.  He has not had this repeated in several years because she has not followed with her primary.  I will check a  CBC when she returns.  Covid education: *** She would like to get the vaccine when able.   Current medicines are reviewed at length with the patient today.  The patient does not have concerns regarding medicines.  The following changes have been made:  As above  Labs/ tests ordered today include:   No orders of the defined types were placed in this encounter.    Disposition:   FU with ***    Signed, Rollene Rotunda, MD  11/04/2019 7:48 PM    [facility]

## 2019-11-05 ENCOUNTER — Ambulatory Visit: Payer: BC Managed Care – PPO | Admitting: Cardiology

## 2019-11-05 DIAGNOSIS — Z7189 Other specified counseling: Secondary | ICD-10-CM

## 2019-11-05 DIAGNOSIS — R5383 Other fatigue: Secondary | ICD-10-CM

## 2019-11-05 DIAGNOSIS — I10 Essential (primary) hypertension: Secondary | ICD-10-CM

## 2020-06-17 ENCOUNTER — Encounter (INDEPENDENT_AMBULATORY_CARE_PROVIDER_SITE_OTHER): Payer: Self-pay | Admitting: Ophthalmology

## 2020-07-15 ENCOUNTER — Ambulatory Visit (INDEPENDENT_AMBULATORY_CARE_PROVIDER_SITE_OTHER): Payer: 59 | Admitting: Ophthalmology

## 2020-07-15 ENCOUNTER — Other Ambulatory Visit: Payer: Self-pay

## 2020-07-15 ENCOUNTER — Encounter (INDEPENDENT_AMBULATORY_CARE_PROVIDER_SITE_OTHER): Payer: Self-pay | Admitting: Ophthalmology

## 2020-07-15 DIAGNOSIS — H43391 Other vitreous opacities, right eye: Secondary | ICD-10-CM | POA: Diagnosis not present

## 2020-07-15 DIAGNOSIS — H43813 Vitreous degeneration, bilateral: Secondary | ICD-10-CM

## 2020-07-15 NOTE — Assessment & Plan Note (Signed)
The nature of posterior vitreous detachment was discussed with the patient as well as its physiology, its age prevalence, and its possible implication regarding retinal breaks and detachment.  An informational brochure was given to the patient.  All the patient's questions were answered.  The patient was asked to return if new or different flashes or floaters develops.   Patient was instructed to contact office immediately if any changes were noticed. I explained to the patient that vitreous inside the eye is similar to jello inside a bowl. As the jello melts it can start to pull away from the bowl, similarly the vitreous throughout our lives can begin to pull away from the retina. That process is called a posterior vitreous detachment. In some cases, the vitreous can tug hard enough on the retina to form a retinal tear. I discussed with the patient the signs and symptoms of a retinal detachment.  Do not rub the eye. Symptomatic partial vitreous detachment with floaters,  I explained to the patient that her current symptoms are her new baseline.  She should make [place] aware promptly if a profound worsening of those develop.  I reassured her this is a normal consequence of acquiring wisdom over time.

## 2020-07-15 NOTE — Assessment & Plan Note (Signed)
See comments regarding vitreous floaters of the right eye with a partial vitreous detachment right eye symptomatic but no pathologic findings OU

## 2020-07-15 NOTE — Progress Notes (Signed)
07/15/2020     CHIEF COMPLAINT Patient presents for Retina Evaluation (NP ERM OD, ref'd by R. Thurmond ///Pt reports blurry vision and trouble focusing OU, dark floaters that come and go OU, no pain or pressure OU.  )   HISTORY OF PRESENT ILLNESS: Paula Nelson is a 50 y.o. female who presents to the clinic today for:   HPI    Retina Evaluation    In right eye.  This started 6 months ago.  Duration of 6 months.  Associated Symptoms Floaters.  Context:  distance vision and near vision.  Response to treatment was no improvement. Additional comments: NP ERM OD, ref'd by R. Thurmond    Pt reports blurry vision and trouble focusing OU, dark floaters that come and go OU, no pain or pressure OU.            Comments    Patient sometimes sees floaters, only OD. Often times they are not visible.        Last edited by Edmon Crape, MD on 07/15/2020 11:02 AM. (History)      Referring physician: Deatra James, MD [phone] [place],  [place] [address]  HISTORICAL INFORMATION:   Selected notes from the MEDICAL RECORD NUMBER       CURRENT MEDICATIONS: No current outpatient medications on file. (Ophthalmic Drugs)   No current facility-administered medications for this visit. (Ophthalmic Drugs)   Current Outpatient Medications (Other)  Medication Sig  . amLODipine (NORVASC) 5 MG tablet Take 1 tablet (5 mg total) by mouth daily.  . benazepril (LOTENSIN) 20 MG tablet Take 1 tablet (20 mg total) by mouth 2 (two) times daily.  . chlorthalidone (HYGROTON) 25 MG tablet Take 1 tablet (25 mg total) by mouth daily.  . famotidine (PEPCID) 20 MG tablet Take 1 tablet (20 mg total) by mouth 2 (two) times daily. (Patient taking differently: Take 20 mg by mouth daily. )  . ibuprofen (ADVIL,MOTRIN) 600 MG tablet Take 1 tablet (600 mg total) by mouth every 6 (six) hours as needed for pain.  . metoprolol tartrate (LOPRESSOR) 100 MG tablet Take 1 tablet (100 mg total) by mouth once for 1  dose.  . nitroGLYCERIN (NITROSTAT) 0.4 MG SL tablet Place 1 tablet (0.4 mg total) under the tongue every 5 (five) minutes as needed for chest pain.  Marland Kitchen spironolactone (ALDACTONE) 50 MG tablet Take 1 tablet (50 mg total) by mouth daily.   No current facility-administered medications for this visit. (Other)      REVIEW OF SYSTEMS:    ALLERGIES No Known Allergies  PAST MEDICAL HISTORY Past Medical History:  Diagnosis Date  . Chest pain    a. 06/2015: ETT with EKG showing no acute changes.  . Hypertension   . Palpitations    Past Surgical History:  Procedure Laterality Date  . CHOLECYSTECTOMY    . TUBAL LIGATION      FAMILY HISTORY Family History  Problem Relation Age of Onset  . Atrial fibrillation Father   . Hypertension Father   . Hypertension Mother     SOCIAL HISTORY Social History   Tobacco Use  . Smoking status: Never Smoker  . Smokeless tobacco: Never Used  Vaping Use  . Vaping Use: Never used  Substance Use Topics  . Alcohol use: Yes  . Drug use: No         OPHTHALMIC EXAM:  Base Eye Exam    Visual Acuity (ETDRS)      Right Left  Dist cc 20/25 -2 20/20 -2   Correction: Glasses       Tonometry (Tonopen, 9:51 AM)      Right Left   Pressure 26 20  Squinting and moving away       Pupils      Pupils Dark Light Shape React APD   Right PERRL 4 3 Round Brisk None   Left PERRL 4 3 Round Brisk None       Visual Fields (Counting fingers)      Left Right    Full Full       Extraocular Movement      Right Left    Full Full       Neuro/Psych    Oriented x3: Yes       Dilation    Both eyes: 1.0% Mydriacyl, 2.5% Phenylephrine @ 9:51 AM        Slit Lamp and Fundus Exam    External Exam      Right Left   External Normal Normal       Slit Lamp Exam      Right Left   Lids/Lashes Normal Normal   Conjunctiva/Sclera White and quiet White and quiet   Cornea Clear Clear   Anterior Chamber Deep and quiet Deep and quiet   Iris Round  and reactive Round and reactive   Lens Nuclear sclerosis Nuclear sclerosis   Anterior Vitreous Normal Normal       Fundus Exam      Right Left   Posterior Vitreous Normal, minor central floaters, no obvious PVD Normal, minor central floaters, no obvious PVD   Disc Normal Normal   C/D Ratio 0.25 0.25-0.3   Macula Normal Normal   Vessels Normal Normal   Periphery Normal, no holes 25 DN 20 D to the ora serrata no holes          IMAGING AND PROCEDURES  Imaging and Procedures for 07/15/20  OCT, Retina - OU - Both Eyes       Right Eye Quality was good. Scan locations included subfoveal. Central Foveal Thickness: 227. Progression has no prior data. Findings include normal foveal contour.   Left Eye Quality was good. Scan locations included subfoveal. Central Foveal Thickness: 224. Progression has no prior data. Findings include normal foveal contour.   Notes OD with prefoveal floater, possible posterior partial vitreous detachment  Similar prefoveal opercula apparent on OCT OS, neither are pathologic at this time       Color Fundus Photography Optos - OU - Both Eyes       Right Eye Progression has no prior data. Disc findings include normal observations. Macula : normal observations. Vessels : normal observations. Periphery : normal observations.   Left Eye Progression has no prior data. Macula : normal observations. Periphery : normal observations.   Notes No retinal holes tears or detachment, normal Posterior pole and macular findings OU                ASSESSMENT/PLAN:  Vitreous floaters of right eye   The nature of posterior vitreous detachment was discussed with the patient as well as its physiology, its age prevalence, and its possible implication regarding retinal breaks and detachment.  An informational brochure was given to the patient.  All the patient's questions were answered.  The patient was asked to return if new or different flashes or floaters  develops.   Patient was instructed to contact office immediately if any changes were noticed. I explained to the  patient that vitreous inside the eye is similar to jello inside a bowl. As the jello melts it can start to pull away from the bowl, similarly the vitreous throughout our lives can begin to pull away from the retina. That process is called a posterior vitreous detachment. In some cases, the vitreous can tug hard enough on the retina to form a retinal tear. I discussed with the patient the signs and symptoms of a retinal detachment.  Do not rub the eye. Symptomatic partial vitreous detachment with floaters,  I explained to the patient that her current symptoms are her new baseline.  She should make [place] aware promptly if a profound worsening of those develop.  I reassured her this is a normal consequence of acquiring wisdom over time.  Vitreous liquefaction, bilateral See comments regarding vitreous floaters of the right eye with a partial vitreous detachment right eye symptomatic but no pathologic findings OU      ICD-10-CM   1. Vitreous floaters of right eye  H43.391 Color Fundus Photography Optos - OU - Both Eyes  2. Vitreous liquefaction, bilateral  H43.813 OCT, Retina - OU - Both Eyes    1.  2.  3.  Ophthalmic Meds Ordered this visit:  No orders of the defined types were placed in this encounter.      Return if symptoms worsen or fail to improve, for Follow-up Dr. Santiago Bumpers as scheduled with.  There are no Patient Instructions on file for this visit.   Explained the diagnoses, plan, and follow up with the patient and they expressed understanding.  Patient expressed understanding of the importance of proper follow up care.   Alford Highland Lequita Meadowcroft M.D. Diseases & Surgery of the Retina and Vitreous [facility] 07/15/20     Abbreviations: M myopia (nearsighted); A astigmatism; H hyperopia (farsighted); P presbyopia; Mrx spectacle prescription;  CTL  contact lenses; OD right eye; OS left eye; OU both eyes  XT exotropia; ET esotropia; PEK punctate epithelial keratitis; PEE punctate epithelial erosions; DES dry eye syndrome; MGD meibomian gland dysfunction; ATs artificial tears; PFAT's preservative free artificial tears; NSC nuclear sclerotic cataract; PSC posterior subcapsular cataract; ERM epi-retinal membrane; PVD posterior vitreous detachment; RD retinal detachment; DM diabetes mellitus; DR diabetic retinopathy; NPDR non-proliferative diabetic retinopathy; PDR proliferative diabetic retinopathy; CSME clinically significant macular edema; DME diabetic macular edema; dbh dot blot hemorrhages; CWS cotton wool spot; POAG primary open angle glaucoma; C/D cup-to-disc ratio; HVF humphrey visual field; GVF goldmann visual field; OCT optical coherence tomography; IOP intraocular pressure; BRVO Branch retinal vein occlusion; CRVO central retinal vein occlusion; CRAO central retinal artery occlusion; BRAO branch retinal artery occlusion; RT retinal tear; SB scleral buckle; PPV pars plana vitrectomy; VH Vitreous hemorrhage; PRP panretinal laser photocoagulation; IVK intravitreal kenalog; VMT vitreomacular traction; MH Macular hole;  NVD neovascularization of the disc; NVE neovascularization elsewhere; AREDS age related eye disease study; ARMD age related macular degeneration; POAG primary open angle glaucoma; EBMD epithelial/anterior basement membrane dystrophy; ACIOL anterior chamber intraocular lens; IOL intraocular lens; PCIOL posterior chamber intraocular lens; Phaco/IOL phacoemulsification with intraocular lens placement; PRK photorefractive keratectomy; LASIK laser assisted in situ keratomileusis; HTN hypertension; DM diabetes mellitus; COPD chronic obstructive pulmonary disease

## 2022-01-10 ENCOUNTER — Other Ambulatory Visit: Payer: Self-pay | Admitting: Obstetrics and Gynecology

## 2022-01-10 DIAGNOSIS — R928 Other abnormal and inconclusive findings on diagnostic imaging of breast: Secondary | ICD-10-CM

## 2022-01-11 ENCOUNTER — Telehealth: Payer: Self-pay | Admitting: Physician Assistant

## 2022-01-11 NOTE — Telephone Encounter (Signed)
Scheduled appt per 7/10 referral. Pt is aware of appt date and time. Pt is aware to arrive 15 mins prior to appt time and to bring and updated insurance card. Pt is aware of appt location.   

## 2022-01-19 ENCOUNTER — Ambulatory Visit
Admission: RE | Admit: 2022-01-19 | Discharge: 2022-01-19 | Disposition: A | Discharge status: Home / Self Care | Payer: BC Managed Care – PPO | Source: Ambulatory Visit | Attending: Obstetrics and Gynecology | Admitting: Obstetrics and Gynecology

## 2022-01-19 ENCOUNTER — Other Ambulatory Visit: Payer: Self-pay | Admitting: Obstetrics and Gynecology

## 2022-01-19 DIAGNOSIS — R921 Mammographic calcification found on diagnostic imaging of breast: Secondary | ICD-10-CM

## 2022-01-19 DIAGNOSIS — R928 Other abnormal and inconclusive findings on diagnostic imaging of breast: Secondary | ICD-10-CM

## 2022-01-27 ENCOUNTER — Ambulatory Visit
Admission: RE | Admit: 2022-01-27 | Discharge: 2022-01-27 | Disposition: A | Discharge status: Home / Self Care | Payer: BC Managed Care – PPO | Source: Ambulatory Visit | Attending: Obstetrics and Gynecology | Admitting: Obstetrics and Gynecology

## 2022-01-27 DIAGNOSIS — R921 Mammographic calcification found on diagnostic imaging of breast: Secondary | ICD-10-CM

## 2022-02-01 NOTE — Progress Notes (Unsigned)
[facility] [phone]   [phone]  CONSULT NOTE  REFERRING PHYSICIAN: Dr. Henderson Cloud   REASON FOR CONSULTATION:  Anemia   HPI Paula Nelson is a 51 y.o. female with past medical history significant for hypertension is referred to clinic for anemia   The patient saw her GYN for an annual exam on 01/05/2022.  The patient was feeling fine that day without any concerning complaints.  She had lab work performed that day which showed microcytic anemia with a hemoglobin low at 8.6, low MCV at 66, and elevated RDW 20.2.  White blood count and platelet count were within normal limits.  She was referred to clinic regarding this finding.  The patient is concerned about her anemia since she eats a diet rich in iron***.  She states she eats lots of ****vegetables as well as green leafy foods such as spinach and kale.  Ongoing fatigue, cold intolerance, lightheadedness, dyspnea on exertion.   She feels like she has become more symptomatic over ***   Ongoing anemia. Oldest labs I have are 2013. Hbg 11.8 which typically is 8-11's  I do not have any prior labs to see how long she has had anemia.   She states she had anemia when she was *** years old and was on *** supplements.  She is unsure if her anemia improves or how long this recent bout of significant anemia has been occurring.  She denies ever needing an iron infusion or blood transfusion before***. Iron? Compliant ***.   She took iron supplements since*** Prescription or OTC. she states she has significant constipation.  She would be open to trying prescription iron.  Denies any other history of vitamin deficiencies except for vitamin D.   Regarding abnormal bleeding or bruising, she reports she has ***.  GI. Denies history of heavy menstrual periods ***.  She states her period last *** days and the *** with going through *** heavy ***.  *** clotting in her menstrual periods.   She does *** have a personal  history of fibroids.  She denies any other abnormal bleeding including gingival bleeding, hemoptysis, hematemesis, melena, hematochezia, or hematuria.  Denies any blood thinner use. Denies any chills, night sweats, unexplained weight loss, or lymphadenopathy.  Denies any NSAID use.  She reports craving ice chips.  Denies any chronic kidney disease.  Denies any history of any bariatric surgery.      HPI  Past Medical History:  Diagnosis Date   Chest pain    a. 06/2015: ETT with EKG showing no acute changes.   Hypertension    Palpitations     Past Surgical History:  Procedure Laterality Date   CHOLECYSTECTOMY     TUBAL LIGATION      Family History  Problem Relation Age of Onset   Atrial fibrillation Father    Hypertension Father    Hypertension Mother     Social History Social History   Tobacco Use   Smoking status: Never   Smokeless tobacco: Never  Vaping Use   Vaping Use: Never used  Substance Use Topics   Alcohol use: Yes   Drug use: No    No Known Allergies  Current Outpatient Medications  Medication Sig Dispense Refill   amLODipine (NORVASC) 5 MG tablet Take 1 tablet (5 mg total) by mouth daily. 90 tablet 3   benazepril (LOTENSIN) 20 MG tablet Take 1 tablet (20 mg total) by mouth 2 (two) times daily. 180 tablet 3  chlorthalidone (HYGROTON) 25 MG tablet Take 1 tablet (25 mg total) by mouth daily. 90 tablet 3   famotidine (PEPCID) 20 MG tablet Take 1 tablet (20 mg total) by mouth 2 (two) times daily. (Patient taking differently: Take 20 mg by mouth daily. ) 30 tablet 0   ibuprofen (ADVIL,MOTRIN) 600 MG tablet Take 1 tablet (600 mg total) by mouth every 6 (six) hours as needed for pain. 30 tablet 0   metoprolol tartrate (LOPRESSOR) 100 MG tablet Take 1 tablet (100 mg total) by mouth once for 1 dose. 1 tablet 0   nitroGLYCERIN (NITROSTAT) 0.4 MG SL tablet Place 1 tablet (0.4 mg total) under the tongue every 5 (five) minutes as needed for chest pain. 25 tablet 1    spironolactone (ALDACTONE) 50 MG tablet Take 1 tablet (50 mg total) by mouth daily. 90 tablet 3   No current facility-administered medications for this visit.    REVIEW OF SYSTEMS:   Review of Systems  Constitutional: Negative for appetite change, chills, fatigue, fever and unexpected weight change.  HENT:   Negative for mouth sores, nosebleeds, sore throat and trouble swallowing.   Eyes: Negative for eye problems and icterus.  Respiratory: Negative for cough, hemoptysis, shortness of breath and wheezing.   Cardiovascular: Negative for chest pain and leg swelling.  Gastrointestinal: Negative for abdominal pain, constipation, diarrhea, nausea and vomiting.  Genitourinary: Negative for bladder incontinence, difficulty urinating, dysuria, frequency and hematuria.   Musculoskeletal: Negative for back pain, gait problem, neck pain and neck stiffness.  Skin: Negative for itching and rash.  Neurological: Negative for dizziness, extremity weakness, gait problem, headaches, light-headedness and seizures.  Hematological: Negative for adenopathy. Does not bruise/bleed easily.  Psychiatric/Behavioral: Negative for confusion, depression and sleep disturbance. The patient is not nervous/anxious.     PHYSICAL EXAMINATION:  There were no vitals taken for this visit.  ECOG PERFORMANCE STATUS: {CHL ONC ECOG [id number]  Physical Exam  Constitutional: Oriented to person, place, and time and well-developed, well-nourished, and in no distress. No distress.  HENT:  Head: Normocephalic and atraumatic.  Mouth/Throat: Oropharynx is clear and moist. No oropharyngeal exudate.  Eyes: Conjunctivae are normal. Right eye exhibits no discharge. Left eye exhibits no discharge. No scleral icterus.  Neck: Normal range of motion. Neck supple.  Cardiovascular: Normal rate, regular rhythm, normal heart sounds and intact distal pulses.   Pulmonary/Chest: Effort normal and breath sounds normal. No respiratory distress.  No wheezes. No rales.  Abdominal: Soft. Bowel sounds are normal. Exhibits no distension and no mass. There is no tenderness.  Musculoskeletal: Normal range of motion. Exhibits no edema.  Lymphadenopathy:    No cervical adenopathy.  Neurological: Alert and oriented to person, place, and time. Exhibits normal muscle tone. Gait normal. Coordination normal.  Skin: Skin is warm and dry. No rash noted. Not diaphoretic. No erythema. No pallor.  Psychiatric: Mood, memory and judgment normal.  Vitals reviewed.  LABORATORY DATA: Lab Results  Component Value Date   WBC 6.9 08/19/2019   HGB 8.7 (L) 08/19/2019   HCT 32.5 (L) 08/19/2019   MCV 64 (L) 08/19/2019   PLT 316 08/19/2019      Chemistry      Component Value Date/Time   NA 137 08/19/2019 1156   K 4.8 08/19/2019 1156   CL 99 08/19/2019 1156   CO2 22 08/19/2019 1156   BUN 16 08/19/2019 1156   CREATININE 0.90 10/07/2019 1545   CREATININE 0.87 02/21/2017 1619      Component Value Date/Time  CALCIUM 10.2 08/19/2019 1156   ALKPHOS 58 01/17/2015 0021   AST 18 01/17/2015 0021   ALT 14 01/17/2015 0021   BILITOT 0.3 01/17/2015 0021       RADIOGRAPHIC STUDIES: MM CLIP PLACEMENT RIGHT  Result Date: 01/27/2022 CLINICAL DATA:  Evaluate biopsy marker EXAM: 3D DIAGNOSTIC RIGHT MAMMOGRAM POST STEREOTACTIC BIOPSY COMPARISON:  Previous exam(s). FINDINGS: 3D Mammographic images were obtained following stereotactic guided biopsy of right breast calcifications. The X shaped biopsy marker migrated 1.4 cm inferior to the biopsied calcifications. IMPRESSION: The X shaped biopsy marker migrated 1.4 cm inferior to the biopsied calcifications. Final Assessment: Post Procedure Mammograms for Marker Placement Electronically Signed   By: Gerome Sam III M.D.   On: 01/27/2022 12:15  MM RT BREAST BX W LOC DEV 1ST LESION IMAGE BX SPEC STEREO GUIDE  Result Date: 01/27/2022 CLINICAL DATA:  Biopsy of right breast calcifications EXAM: RIGHT BREAST  STEREOTACTIC CORE NEEDLE BIOPSY COMPARISON:  Previous exam(s). FINDINGS: The patient and I discussed the procedure of stereotactic-guided biopsy including benefits and alternatives. We discussed the high likelihood of a successful procedure. We discussed the risks of the procedure including infection, bleeding, tissue injury, clip migration, and inadequate sampling. Informed written consent was given. The usual time out protocol was performed immediately prior to the procedure. Using sterile technique and 1% Lidocaine as local anesthetic, under stereotactic guidance, a 9 gauge vacuum assisted device was used to perform core needle biopsy of calcifications in the inner right breast using a superior approach. Specimen radiograph was performed showing calcifications in several core specimens. Specimens with calcifications are identified for pathology. Lesion quadrant: Inner right breast At the conclusion of the procedure, an X shaped tissue marker clip was deployed into the biopsy cavity. Follow-up 2-view mammogram was performed and dictated separately. IMPRESSION: Stereotactic-guided biopsy of right breast calcifications. No apparent complications. Electronically Signed   By: Gerome Sam III M.D.   On: 01/27/2022 12:04  MM Digital Diagnostic Unilat R  Result Date: 01/19/2022 CLINICAL DATA:  Patient returns today to evaluate RIGHT breast calcifications identified on recent screening mammogram. EXAM: DIGITAL DIAGNOSTIC UNILATERAL RIGHT MAMMOGRAM TECHNIQUE: Right digital diagnostic mammography was performed. Mammographic images were processed with CAD. COMPARISON:  Previous exams including recent screening mammogram dated 01/05/2022. ACR Breast Density Category b: There are scattered areas of fibroglandular density. FINDINGS: On today's additional diagnostic views, including magnification views, grouped coarse heterogeneous calcifications are confirmed within the inner RIGHT breast, measuring 5 mm extent, with a  suspicious linear distribution. IMPRESSION: Grouped coarse heterogeneous calcifications within the inner RIGHT breast, measuring 5 mm extent, with a suspicious linear distribution. After further review of the earlier screening mammogram views with 3D tomosynthesis, these may represent vascular calcifications. RECOMMENDATION: 1. Preprocedure spot compression views with tomosynthesis of the RIGHT breast calcifications to assess the relationship of the calcifications and surrounding blood vessels. If the calcifications are felt to be most likely vascular in nature based on these additional views, would recommend follow-up RIGHT breast diagnostic mammogram in 6 months. 2. Otherwise, stereotactic biopsy is recommended to exclude malignancy. Stereotactic biopsy is scheduled on July 27th. I have discussed the findings and recommendations with the patient. If applicable, a reminder letter will be sent to the patient regarding the next appointment. BI-RADS CATEGORY  4: Suspicious. Electronically Signed   By: Bary Richard M.D.   On: 01/19/2022 12:18   ASSESSMENT: This is a very pleasant 51 year old African-American female referred to clinic for anemia.  The patient had several lab studies performed  today including a CBC, CMP, iron studies, ferritin, SPEP with immunofixation, vitamin B12, and folic acid performed.  The patient's lab studies from today demonstrate ***  The patient's labs are consistent with iron deficiency anemia.  We will arrange for her to receive an IV iron infusion with Venofer 300 mg weekly x3 at the [facility] infusion center.  We will arrange for her first infusion to take place next week.  The patient was encouraged to continue to take her over-the-counter iron supplement.  She was encouraged to take this with vitamin C as that helps facilitate iron absorption.  She was also given a handout of iron rich food and was strongly encouraged to increase her dietary intake of iron rich  food.  GYN?  GI?  We will see her back for follow-up visit in 2 months for evaluation and repeat CBC, iron studies, and ferritin.  The patient voices understanding of current disease status and treatment options and is in agreement with the current care plan.  All questions were answered. The patient knows to call the clinic with any problems, questions or concerns. We can certainly see the patient much sooner if necessary.  Thank you so much for allowing me to participate in the care of Paula Nelson. I will continue to follow up the patient with you and assist in her care.  I spent {CHL ONC TIME VISIT - NKNLZ:[id number]} counseling the patient face to face. The total time spent in the appointment was {CHL ONC TIME VISIT - KWIOX:[id number]}.  Disclaimer: This note was dictated with voice recognition software. Similar sounding words can inadvertently be transcribed and may not be corrected upon review.   Janari Gagner L Valente Fosberg February 01, 2022, 9:36 AM

## 2022-02-02 ENCOUNTER — Other Ambulatory Visit: Payer: Self-pay | Admitting: Physician Assistant

## 2022-02-02 DIAGNOSIS — D649 Anemia, unspecified: Secondary | ICD-10-CM

## 2022-02-03 ENCOUNTER — Telehealth: Payer: Self-pay | Admitting: Physician Assistant

## 2022-02-03 ENCOUNTER — Other Ambulatory Visit: Payer: Self-pay | Admitting: Physician Assistant

## 2022-02-03 ENCOUNTER — Inpatient Hospital Stay: Payer: BC Managed Care – PPO | Attending: Physician Assistant | Admitting: Physician Assistant

## 2022-02-03 ENCOUNTER — Inpatient Hospital Stay: Payer: BC Managed Care – PPO

## 2022-02-03 ENCOUNTER — Other Ambulatory Visit: Payer: Self-pay

## 2022-02-03 VITALS — BP 163/86 | HR 86 | Temp 97.7°F | Resp 20 | Wt 171.9 lb

## 2022-02-03 DIAGNOSIS — E538 Deficiency of other specified B group vitamins: Secondary | ICD-10-CM | POA: Insufficient documentation

## 2022-02-03 DIAGNOSIS — R921 Mammographic calcification found on diagnostic imaging of breast: Secondary | ICD-10-CM | POA: Insufficient documentation

## 2022-02-03 DIAGNOSIS — I10 Essential (primary) hypertension: Secondary | ICD-10-CM | POA: Insufficient documentation

## 2022-02-03 DIAGNOSIS — R519 Headache, unspecified: Secondary | ICD-10-CM | POA: Diagnosis not present

## 2022-02-03 DIAGNOSIS — D509 Iron deficiency anemia, unspecified: Secondary | ICD-10-CM | POA: Diagnosis not present

## 2022-02-03 DIAGNOSIS — R110 Nausea: Secondary | ICD-10-CM | POA: Insufficient documentation

## 2022-02-03 DIAGNOSIS — R5383 Other fatigue: Secondary | ICD-10-CM | POA: Insufficient documentation

## 2022-02-03 DIAGNOSIS — R0789 Other chest pain: Secondary | ICD-10-CM | POA: Diagnosis not present

## 2022-02-03 DIAGNOSIS — Z803 Family history of malignant neoplasm of breast: Secondary | ICD-10-CM | POA: Diagnosis not present

## 2022-02-03 DIAGNOSIS — D649 Anemia, unspecified: Secondary | ICD-10-CM

## 2022-02-03 DIAGNOSIS — N920 Excessive and frequent menstruation with regular cycle: Secondary | ICD-10-CM | POA: Diagnosis not present

## 2022-02-03 DIAGNOSIS — R011 Cardiac murmur, unspecified: Secondary | ICD-10-CM | POA: Diagnosis not present

## 2022-02-03 DIAGNOSIS — D500 Iron deficiency anemia secondary to blood loss (chronic): Secondary | ICD-10-CM | POA: Insufficient documentation

## 2022-02-03 DIAGNOSIS — R040 Epistaxis: Secondary | ICD-10-CM | POA: Diagnosis not present

## 2022-02-03 DIAGNOSIS — Z9049 Acquired absence of other specified parts of digestive tract: Secondary | ICD-10-CM | POA: Diagnosis not present

## 2022-02-03 DIAGNOSIS — Z808 Family history of malignant neoplasm of other organs or systems: Secondary | ICD-10-CM

## 2022-02-03 DIAGNOSIS — Z79899 Other long term (current) drug therapy: Secondary | ICD-10-CM | POA: Insufficient documentation

## 2022-02-03 DIAGNOSIS — Z8249 Family history of ischemic heart disease and other diseases of the circulatory system: Secondary | ICD-10-CM | POA: Diagnosis not present

## 2022-02-03 LAB — CMP (CANCER CENTER ONLY)
ALT: 10 U/L (ref 0–44)
AST: 13 U/L — ABNORMAL LOW (ref 15–41)
Albumin: 4.2 g/dL (ref 3.5–5.0)
Alkaline Phosphatase: 56 U/L (ref 38–126)
Anion gap: 6 (ref 5–15)
BUN: 14 mg/dL (ref 6–20)
CO2: 26 mmol/L (ref 22–32)
Calcium: 9.8 mg/dL (ref 8.9–10.3)
Chloride: 107 mmol/L (ref 98–111)
Creatinine: 0.94 mg/dL (ref 0.44–1.00)
GFR, Estimated: >60 mL/min (ref >60)
Glucose, Bld: 93 mg/dL (ref 70–99)
Potassium: 4 mmol/L (ref 3.5–5.1)
Sodium: 139 mmol/L (ref 135–145)
Total Bilirubin: 0.3 mg/dL (ref 0.3–1.2)
Total Protein: 7.1 g/dL (ref 6.5–8.1)

## 2022-02-03 LAB — FERRITIN: Ferritin: 2 ng/mL — ABNORMAL LOW (ref 11–307)

## 2022-02-03 LAB — CBC WITH DIFFERENTIAL (CANCER CENTER ONLY)
Abs Immature Granulocytes: 0 10*3/uL (ref 0.00–0.07)
Basophils Absolute: 0 10*3/uL (ref 0.0–0.1)
Basophils Relative: 0 %
Eosinophils Absolute: 0.1 10*3/uL (ref 0.0–0.5)
Eosinophils Relative: 1 %
HCT: 28.8 % — ABNORMAL LOW (ref 36.0–46.0)
Hemoglobin: 7.9 g/dL — ABNORMAL LOW (ref 12.0–15.0)
Lymphocytes Relative: 47 %
Lymphs Abs: 2.7 10*3/uL (ref 0.7–4.0)
MCH: 18.5 pg — ABNORMAL LOW (ref 26.0–34.0)
MCHC: 27.4 g/dL — ABNORMAL LOW (ref 30.0–36.0)
MCV: 67.4 fL — ABNORMAL LOW (ref 80.0–100.0)
Monocytes Absolute: 0.3 10*3/uL (ref 0.1–1.0)
Monocytes Relative: 6 %
Neutro Abs: 2.6 10*3/uL (ref 1.7–7.7)
Neutrophils Relative %: 46 %
Platelet Count: 262 10*3/uL (ref 150–400)
RBC: 4.27 MIL/uL (ref 3.87–5.11)
RDW: 20 % — ABNORMAL HIGH (ref 11.5–15.5)
WBC Count: 5.7 10*3/uL (ref 4.0–10.5)
nRBC: 0 % (ref 0.0–0.2)

## 2022-02-03 LAB — SAMPLE TO BLOOD BANK

## 2022-02-03 LAB — VITAMIN B12: Vitamin B-12: 173 pg/mL — ABNORMAL LOW (ref 180–914)

## 2022-02-03 LAB — ABO/RH: ABO/RH(D): O NEG

## 2022-02-03 LAB — IRON AND IRON BINDING CAPACITY (CC-WL,HP ONLY)
Iron: 12 ug/dL — ABNORMAL LOW (ref 28–170)
Saturation Ratios: 2 % — ABNORMAL LOW (ref 10.4–31.8)
TIBC: 490 ug/dL — ABNORMAL HIGH (ref 250–450)
UIBC: 478 ug/dL — ABNORMAL HIGH (ref 148–442)

## 2022-02-03 LAB — FOLATE: Folate: 10.4 ng/mL (ref >5.9)

## 2022-02-03 NOTE — Patient Instructions (Signed)
Thank you for choosing [facility] to provide your care.   Should you have questions after your visit to the [facility] ([facility]), please contact this office at [phone] between 8:30 AM and 4:30 PM.  Voice mails left after 4:00 PM may not be returned until the following business day.  Calls received after 4:30 PM will be answered by an off-site Nurse Triage Line.    Prescription Refills:  Please have your pharmacy contact us directly for most prescription requests.  Contact the office directly for refills of narcotics (pain medications). Allow 48-72 hours for refills.  Appointments: Please contact the [facility] scheduling department [phone] for questions regarding [facility] appointment scheduling.  Contact the schedulers with any scheduling changes so that your appointment can be rescheduled in a timely manner.   Central Scheduling for [place] [phone] - Call to schedule PET scan, CT scan, MRI, and Ultrasound.  To afford each patient quality time with our providers, please arrive 30 minutes before your scheduled appointment time.  If you arrive late for your appointment, you may be asked to reschedule.  We strive to give you quality time with our providers, and arriving late affects you and other patients whose appointments are after yours. If you are a no show for multiple scheduled visits, you may be dismissed from the clinic at the providers discretion.     Resources: [facility] Social Workers [phone] for additional information on assistance programs or assistance connecting with community support programs   [place] DSS  [phone]: Information regarding food stamps, Medicaid, and utility assistance [place] Access [place] [phone]   [employer]'s shared-ride transportation service for eligible riders who have a disability that prevents them from riding the fixed route bus.   Medicare Rights Center 800-333-4114 Helps people with  Medicare understand their rights and benefits, navigate the Medicare system, and secure the quality healthcare they deserve [employer] 800-227-2345 Assists patients locate various types of support and financial assistance Cancer Care: 1-800-813-HOPE (4673) Provides financial assistance, online support groups, medication/co-pay assistance.   Transportation Assistance for appointments at [facility]:Please inform scheduler or provider support staff for referral to Transportation Assistance   Again, thank you for choosing [facility] for your care.       

## 2022-02-03 NOTE — Telephone Encounter (Signed)
I called the patient and left a voicemail and let her know that her vitamin B12 is also low.  I would recommend getting B12 injections weekly x4 and monthly thereon after.  This can be given on the day of her IV iron infusions.  If she has any questions about this, she was given our callback number

## 2022-02-04 ENCOUNTER — Telehealth: Payer: Self-pay

## 2022-02-04 ENCOUNTER — Telehealth: Payer: Self-pay | Admitting: Internal Medicine

## 2022-02-04 NOTE — Telephone Encounter (Signed)
Referral has been sent to:    [facility]: [phone] FX: [phone]   Referral Insurance/Demographics Progress note Labs   Confirmation was received.

## 2022-02-04 NOTE — Telephone Encounter (Signed)
Pt called back and advised she deleted her VM in error and wanted to know what the message was. I have relayed this information to as indicated. Pt expressed understanding of this information.

## 2022-02-04 NOTE — Telephone Encounter (Signed)
Scheduled per 08/03 los, patient has been called and voicemail was left. 

## 2022-02-07 LAB — PROTEIN ELECTROPHORESIS, SERUM, WITH REFLEX
A/G Ratio: 1.3 (ref 0.7–1.7)
Albumin ELP: 3.7 g/dL (ref 2.9–4.4)
Alpha-1-Globulin: 0.2 g/dL (ref 0.0–0.4)
Alpha-2-Globulin: 0.6 g/dL (ref 0.4–1.0)
Beta Globulin: 1 g/dL (ref 0.7–1.3)
Gamma Globulin: 0.9 g/dL (ref 0.4–1.8)
Globulin, Total: 2.8 g/dL (ref 2.2–3.9)
Total Protein ELP: 6.5 g/dL (ref 6.0–8.5)

## 2022-02-11 ENCOUNTER — Inpatient Hospital Stay: Payer: BC Managed Care – PPO

## 2022-02-11 ENCOUNTER — Other Ambulatory Visit: Payer: Self-pay

## 2022-02-11 VITALS — BP 148/78 | HR 78 | Temp 98.2°F | Resp 18

## 2022-02-11 DIAGNOSIS — D500 Iron deficiency anemia secondary to blood loss (chronic): Secondary | ICD-10-CM | POA: Diagnosis not present

## 2022-02-11 DIAGNOSIS — D509 Iron deficiency anemia, unspecified: Secondary | ICD-10-CM

## 2022-02-11 MED ORDER — CYANOCOBALAMIN 1000 MCG/ML IJ SOLN
1000.0000 ug | Freq: Once | INTRAMUSCULAR | Status: AC
  Administered 2022-02-11: 1000 ug via INTRAMUSCULAR

## 2022-02-11 MED ORDER — DIPHENHYDRAMINE HCL 25 MG PO CAPS
50.0000 mg | ORAL_CAPSULE | Freq: Once | ORAL | Status: AC
  Administered 2022-02-11: 50 mg via ORAL

## 2022-02-11 MED ORDER — SODIUM CHLORIDE 0.9 % IV SOLN: Freq: Once | INTRAVENOUS | Status: AC

## 2022-02-11 MED ORDER — SODIUM CHLORIDE 0.9 % IV SOLN
300.0000 mg | Freq: Once | INTRAVENOUS | Status: AC
  Administered 2022-02-11: 300 mg via INTRAVENOUS
  Filled 2022-02-11: qty 300

## 2022-02-11 MED ORDER — ACETAMINOPHEN 325 MG PO TABS
650.0000 mg | ORAL_TABLET | Freq: Once | ORAL | Status: AC
  Administered 2022-02-11: 650 mg via ORAL

## 2022-02-11 NOTE — Patient Instructions (Signed)

## 2022-02-11 NOTE — Progress Notes (Signed)
Patient waited 30 minute post observation for iron with no issues vss upon discharge.

## 2022-02-16 ENCOUNTER — Inpatient Hospital Stay: Payer: BC Managed Care – PPO

## 2022-02-16 ENCOUNTER — Other Ambulatory Visit: Payer: Self-pay

## 2022-02-16 VITALS — BP 153/88 | HR 70 | Temp 98.0°F | Resp 17

## 2022-02-16 DIAGNOSIS — D509 Iron deficiency anemia, unspecified: Secondary | ICD-10-CM

## 2022-02-16 DIAGNOSIS — D500 Iron deficiency anemia secondary to blood loss (chronic): Secondary | ICD-10-CM | POA: Diagnosis not present

## 2022-02-16 MED ORDER — ACETAMINOPHEN 325 MG PO TABS
650.0000 mg | ORAL_TABLET | Freq: Once | ORAL | Status: AC
  Administered 2022-02-16: 650 mg via ORAL

## 2022-02-16 MED ORDER — SODIUM CHLORIDE 0.9 % IV SOLN: Freq: Once | INTRAVENOUS | Status: AC

## 2022-02-16 MED ORDER — DIPHENHYDRAMINE HCL 25 MG PO CAPS
50.0000 mg | ORAL_CAPSULE | Freq: Once | ORAL | Status: AC
  Administered 2022-02-16: 50 mg via ORAL

## 2022-02-16 MED ORDER — SODIUM CHLORIDE 0.9 % IV SOLN
300.0000 mg | Freq: Once | INTRAVENOUS | Status: AC
  Administered 2022-02-16: 300 mg via INTRAVENOUS
  Filled 2022-02-16: qty 300

## 2022-02-16 MED ORDER — CYANOCOBALAMIN 1000 MCG/ML IJ SOLN
1000.0000 ug | Freq: Once | INTRAMUSCULAR | Status: AC
  Administered 2022-02-16: 1000 ug via INTRAMUSCULAR

## 2022-02-16 NOTE — Progress Notes (Signed)
Pt declined to stay for 30 min post obs  VSS d/c to lobby

## 2022-02-16 NOTE — Patient Instructions (Signed)

## 2022-02-17 ENCOUNTER — Ambulatory Visit: Payer: BC Managed Care – PPO

## 2022-02-23 ENCOUNTER — Inpatient Hospital Stay: Payer: BC Managed Care – PPO

## 2022-02-23 ENCOUNTER — Telehealth: Payer: Self-pay

## 2022-02-23 ENCOUNTER — Other Ambulatory Visit: Payer: Self-pay | Admitting: Physician Assistant

## 2022-02-23 ENCOUNTER — Other Ambulatory Visit: Payer: Self-pay

## 2022-02-23 VITALS — BP 153/78 | HR 78 | Temp 98.3°F | Resp 18

## 2022-02-23 DIAGNOSIS — D500 Iron deficiency anemia secondary to blood loss (chronic): Secondary | ICD-10-CM | POA: Diagnosis not present

## 2022-02-23 DIAGNOSIS — D509 Iron deficiency anemia, unspecified: Secondary | ICD-10-CM

## 2022-02-23 MED ORDER — SODIUM CHLORIDE 0.9 % IV SOLN: Freq: Once | INTRAVENOUS | Status: AC

## 2022-02-23 MED ORDER — SODIUM CHLORIDE 0.9 % IV SOLN
300.0000 mg | Freq: Once | INTRAVENOUS | Status: AC
  Administered 2022-02-23: 300 mg via INTRAVENOUS
  Filled 2022-02-23: qty 300

## 2022-02-23 MED ORDER — ACETAMINOPHEN 325 MG PO TABS
650.0000 mg | ORAL_TABLET | Freq: Once | ORAL | Status: AC
  Administered 2022-02-23: 650 mg via ORAL
  Filled 2022-02-23: qty 2

## 2022-02-23 MED ORDER — CYANOCOBALAMIN 1000 MCG/ML IJ SOLN
1000.0000 ug | Freq: Once | INTRAMUSCULAR | Status: AC
  Administered 2022-02-23: 1000 ug via INTRAMUSCULAR
  Filled 2022-02-23: qty 1

## 2022-02-23 MED ORDER — DIPHENHYDRAMINE HCL 25 MG PO CAPS
50.0000 mg | ORAL_CAPSULE | Freq: Once | ORAL | Status: AC
  Administered 2022-02-23: 50 mg via ORAL
  Filled 2022-02-23: qty 2

## 2022-02-23 NOTE — Telephone Encounter (Signed)
[facility] returned most recent office note from [facility]. Dr. Wynelle Link states that she is not patient's PCP. Called pt who states that she does not currently have a PCP but will notify clinic when she has one.

## 2022-02-23 NOTE — Patient Instructions (Signed)

## 2022-02-24 ENCOUNTER — Ambulatory Visit: Payer: BC Managed Care – PPO

## 2022-03-02 ENCOUNTER — Other Ambulatory Visit: Payer: Self-pay

## 2022-03-02 ENCOUNTER — Inpatient Hospital Stay: Payer: BC Managed Care – PPO

## 2022-03-02 DIAGNOSIS — D500 Iron deficiency anemia secondary to blood loss (chronic): Secondary | ICD-10-CM | POA: Diagnosis not present

## 2022-03-02 DIAGNOSIS — D509 Iron deficiency anemia, unspecified: Secondary | ICD-10-CM

## 2022-03-02 MED ORDER — CYANOCOBALAMIN 1000 MCG/ML IJ SOLN
1000.0000 ug | Freq: Once | INTRAMUSCULAR | Status: AC
  Administered 2022-03-02: 1000 ug via INTRAMUSCULAR
  Filled 2022-03-02: qty 1

## 2022-03-02 NOTE — Patient Instructions (Signed)
Vitamin B12 Deficiency Vitamin B12 deficiency means that your body does not have enough vitamin B12. The body needs this important vitamin: To make red blood cells. To make genes (DNA). To help the nerves work. If you do not have enough vitamin B12 in your body, you can have health problems, such as not having enough red blood cells in the blood (anemia). What are the causes? Not eating enough foods that contain vitamin B12. Not being able to take in (absorb) vitamin B12 from the food that you eat. Certain diseases. A condition in which the body does not make enough of a certain protein. This results in your body not taking in enough vitamin B12. Having a surgery in which part of the stomach or small intestine is taken out. Taking medicines that make it hard for the body to take in vitamin B12. These include: Heartburn medicines. Some medicines that are used to treat diabetes. What increases the risk? Being an older adult. Eating a vegetarian or vegan diet that does not include any foods that come from animals. Not eating enough foods that contain vitamin B12 while you are pregnant. Taking certain medicines. Having alcoholism. What are the signs or symptoms? In some cases, there are no symptoms. If the condition leads to too few blood cells or nerve damage, symptoms can occur, such as: Feeling weak or tired. Not being hungry. Losing feeling (numbness) or tingling in your hands and feet. Redness and burning of the tongue. Feeling sad (depressed). Confusion or memory problems. Trouble walking. If anemia is very bad, symptoms can include: Being short of breath. Being dizzy. Having a very fast heartbeat. How is this treated? Changing the way you eat and drink, such as: Eating more foods that contain vitamin B12. Drinking little or no alcohol. Getting vitamin B12 shots. Taking vitamin B12 supplements by mouth (orally). Your doctor will tell you the dose that is best for you. Follow  these instructions at home: Eating and drinking  Eat foods that come from animals and have a lot of vitamin B12 in them. These include: Meats and poultry. This includes beef, pork, chicken, turkey, and organ meats, such as liver. Seafood, such as clams, rainbow trout, salmon, tuna, and haddock. Eggs. Dairy foods such as milk, yogurt, and cheese. Eat breakfast cereals that have vitamin B12 added to them (are fortified). Check the label. The items listed above may not be a complete list of foods and beverages you can eat and drink. Contact a dietitian for more information. Alcohol use Do not drink alcohol if: Your doctor tells you not to drink. You are pregnant, may be pregnant, or are planning to become pregnant. If you drink alcohol: Limit how much you have to: 0-1 drink a day for women. 0-2 drinks a day for men. Know how much alcohol is in your drink. In the [place] one drink equals one 12 oz bottle of beer (355 mL), one 5 oz glass of wine (148 mL), or one 1 oz glass of hard liquor (44 mL). General instructions Get any vitamin B12 shots if told by your doctor. Take supplements only as told by your doctor. Follow the directions. Keep all follow-up visits. Contact a doctor if: Your symptoms come back. Your symptoms get worse or do not get better with treatment. Get help right away if: You have trouble breathing. You have a very fast heartbeat. You have chest pain. You get dizzy. You faint. These symptoms may be an emergency. Get help right away. Call 911.   Do not wait to see if the symptoms will go away. Do not drive yourself to the hospital. Summary Vitamin B12 deficiency means that your body is not getting enough of the vitamin. In some cases, there are no symptoms of this condition. Treatment may include making a change in the way you eat and drink, getting shots, or taking supplements. Eat foods that have vitamin B12 in them. This information is not intended to replace advice  given to you by your health care provider. Make sure you discuss any questions you have with your health care provider. Document Revised: 02/12/2021 Document Reviewed: 02/12/2021 Elsevier Patient Education  2023 [employer]  

## 2022-03-03 ENCOUNTER — Ambulatory Visit: Payer: BC Managed Care – PPO

## 2022-04-06 ENCOUNTER — Inpatient Hospital Stay (HOSPITAL_BASED_OUTPATIENT_CLINIC_OR_DEPARTMENT_OTHER): Payer: BC Managed Care – PPO | Admitting: Internal Medicine

## 2022-04-06 ENCOUNTER — Inpatient Hospital Stay: Payer: BC Managed Care – PPO | Attending: Physician Assistant

## 2022-04-06 ENCOUNTER — Inpatient Hospital Stay: Payer: BC Managed Care – PPO

## 2022-04-06 VITALS — BP 154/95 | HR 80 | Temp 98.2°F | Resp 16 | Wt 168.0 lb

## 2022-04-06 DIAGNOSIS — I10 Essential (primary) hypertension: Secondary | ICD-10-CM | POA: Diagnosis not present

## 2022-04-06 DIAGNOSIS — Z79899 Other long term (current) drug therapy: Secondary | ICD-10-CM | POA: Insufficient documentation

## 2022-04-06 DIAGNOSIS — R5383 Other fatigue: Secondary | ICD-10-CM | POA: Insufficient documentation

## 2022-04-06 DIAGNOSIS — D500 Iron deficiency anemia secondary to blood loss (chronic): Secondary | ICD-10-CM | POA: Diagnosis present

## 2022-04-06 DIAGNOSIS — Z9049 Acquired absence of other specified parts of digestive tract: Secondary | ICD-10-CM | POA: Insufficient documentation

## 2022-04-06 DIAGNOSIS — N920 Excessive and frequent menstruation with regular cycle: Secondary | ICD-10-CM | POA: Diagnosis not present

## 2022-04-06 DIAGNOSIS — D509 Iron deficiency anemia, unspecified: Secondary | ICD-10-CM

## 2022-04-06 DIAGNOSIS — E538 Deficiency of other specified B group vitamins: Secondary | ICD-10-CM | POA: Diagnosis not present

## 2022-04-06 DIAGNOSIS — R42 Dizziness and giddiness: Secondary | ICD-10-CM | POA: Diagnosis not present

## 2022-04-06 LAB — CBC WITH DIFFERENTIAL (CANCER CENTER ONLY)
Abs Immature Granulocytes: 0.01 10*3/uL (ref 0.00–0.07)
Basophils Absolute: 0.1 10*3/uL (ref 0.0–0.1)
Basophils Relative: 1 %
Eosinophils Absolute: 0.2 10*3/uL (ref 0.0–0.5)
Eosinophils Relative: 4 %
HCT: 42.9 % (ref 36.0–46.0)
Hemoglobin: 13.2 g/dL (ref 12.0–15.0)
Immature Granulocytes: 0 %
Lymphocytes Relative: 44 %
Lymphs Abs: 2.3 10*3/uL (ref 0.7–4.0)
MCH: 24.9 pg — ABNORMAL LOW (ref 26.0–34.0)
MCHC: 30.8 g/dL (ref 30.0–36.0)
MCV: 80.8 fL (ref 80.0–100.0)
Monocytes Absolute: 0.7 10*3/uL (ref 0.1–1.0)
Monocytes Relative: 13 %
Neutro Abs: 2 10*3/uL (ref 1.7–7.7)
Neutrophils Relative %: 38 %
Platelet Count: 219 10*3/uL (ref 150–400)
RBC: 5.31 MIL/uL — ABNORMAL HIGH (ref 3.87–5.11)
RDW: 25 % — ABNORMAL HIGH (ref 11.5–15.5)
WBC Count: 5.2 10*3/uL (ref 4.0–10.5)
nRBC: 0 % (ref 0.0–0.2)

## 2022-04-06 LAB — IRON AND IRON BINDING CAPACITY (CC-WL,HP ONLY)
Iron: 86 ug/dL (ref 28–170)
Saturation Ratios: 20 % (ref 10.4–31.8)
TIBC: 423 ug/dL (ref 250–450)
UIBC: 337 ug/dL

## 2022-04-06 LAB — FERRITIN: Ferritin: 18 ng/mL (ref 11–307)

## 2022-04-06 MED ORDER — CYANOCOBALAMIN 1000 MCG/ML IJ SOLN
1000.0000 ug | Freq: Once | INTRAMUSCULAR | Status: AC
  Administered 2022-04-06: 1000 ug via INTRAMUSCULAR
  Filled 2022-04-06: qty 1

## 2022-04-06 NOTE — Progress Notes (Signed)
[facility] [phone]   [phone]  OFFICE PROGRESS NOTE  Pcp, No No address on file  DIAGNOSIS: Iron deficiency anemia secondary to menorrhagia.  PRIOR THERAPY: Iron infusion with Venofer 300 Mg IV weekly for 3 weeks.  CURRENT THERAPY: Over-the-counter ferrous sulfate 325 mg p.o. daily.  INTERVAL HISTORY: Paula Nelson 51 y.o. female returns to the clinic today for 20-month follow-up visit.  The patient is feeling fine today with no concerning complaints except for mild fatigue.  She felt much better after receiving iron infusion with Venofer.  She denied having any current chest pain, shortness of breath, cough or hemoptysis.  She has no nausea, vomiting, diarrhea or constipation.  She continues to have occasional dizzy spells.  She also continues to have menorrhagia and she is followed by gynecology.  She is here today for evaluation with repeat CBC, iron study and ferritin.  MEDICAL HISTORY: Past Medical History:  Diagnosis Date   Chest pain    a. 06/2015: ETT with EKG showing no acute changes.   Hypertension    Palpitations     ALLERGIES:  has No Known Allergies.  MEDICATIONS:  Current Outpatient Medications  Medication Sig Dispense Refill   amLODipine (NORVASC) 5 MG tablet Take 1 tablet (5 mg total) by mouth daily. 90 tablet 3   benazepril (LOTENSIN) 20 MG tablet Take 1 tablet (20 mg total) by mouth 2 (two) times daily. 180 tablet 3   chlorthalidone (HYGROTON) 25 MG tablet Take 1 tablet (25 mg total) by mouth daily. (Patient not taking: Reported on 02/03/2022) 90 tablet 3   famotidine (PEPCID) 20 MG tablet Take 1 tablet (20 mg total) by mouth 2 (two) times daily. (Patient taking differently: Take 20 mg by mouth daily. ) 30 tablet 0   ferrous sulfate 325 (65 FE) MG tablet Take 325 mg by mouth every morning.     HYDROCHLOROTHIAZIDE PO Take by mouth.     ibuprofen (ADVIL,MOTRIN) 600 MG tablet Take 1 tablet (600 mg total) by mouth  every 6 (six) hours as needed for pain. 30 tablet 0   metoprolol tartrate (LOPRESSOR) 100 MG tablet Take 1 tablet (100 mg total) by mouth once for 1 dose. 1 tablet 0   nitroGLYCERIN (NITROSTAT) 0.4 MG SL tablet Place 1 tablet (0.4 mg total) under the tongue every 5 (five) minutes as needed for chest pain. 25 tablet 1   spironolactone (ALDACTONE) 50 MG tablet Take 1 tablet (50 mg total) by mouth daily. 90 tablet 3   No current facility-administered medications for this visit.    SURGICAL HISTORY:  Past Surgical History:  Procedure Laterality Date   CHOLECYSTECTOMY     TUBAL LIGATION      REVIEW OF SYSTEMS:  A comprehensive review of systems was negative except for: Constitutional: positive for fatigue Neurological: positive for dizziness   PHYSICAL EXAMINATION: General appearance: alert, cooperative, and fatigued Head: Normocephalic, without obvious abnormality, atraumatic Neck: no adenopathy, no JVD, supple, symmetrical, trachea midline, and thyroid not enlarged, symmetric, no tenderness/mass/nodules Lymph nodes: Cervical, supraclavicular, and axillary nodes normal. Resp: clear to auscultation bilaterally Back: symmetric, no curvature. ROM normal. No CVA tenderness. Cardio: regular rate and rhythm, S1, S2 normal, no murmur, click, rub or gallop GI: soft, non-tender; bowel sounds normal; no masses,  no organomegaly Extremities: extremities normal, atraumatic, no cyanosis or edema  ECOG PERFORMANCE STATUS: 1 - Symptomatic but completely ambulatory  Blood pressure (!) 154/95, pulse 80, temperature 98.2 F (36.8  C), temperature source Oral, resp. rate 16, weight 168 lb (76.2 kg), SpO2 100 %.  LABORATORY DATA: Lab Results  Component Value Date   WBC 5.2 04/06/2022   HGB 13.2 04/06/2022   HCT 42.9 04/06/2022   MCV 80.8 04/06/2022   PLT 219 04/06/2022      Chemistry      Component Value Date/Time   NA 139 02/03/2022 1322   NA 137 08/19/2019 1156   K 4.0 02/03/2022 1322   CL  107 02/03/2022 1322   CO2 26 02/03/2022 1322   BUN 14 02/03/2022 1322   BUN 16 08/19/2019 1156   CREATININE 0.94 02/03/2022 1322   CREATININE 0.87 02/21/2017 1619      Component Value Date/Time   CALCIUM 9.8 02/03/2022 1322   ALKPHOS 56 02/03/2022 1322   AST 13 (L) 02/03/2022 1322   ALT 10 02/03/2022 1322   BILITOT 0.3 02/03/2022 1322       RADIOGRAPHIC STUDIES: No results found.  ASSESSMENT AND PLAN: This is a very pleasant 52 years old African-American female with iron deficiency anemia secondary to menorrhagia.  The patient also has vitamin B12 deficiency.  She was treated with iron infusion with Venofer 300 Mg IV weekly for 3 weeks few months ago and tolerated the infusion fairly well. She had repeat CBC performed earlier today and it showed significant improvement in her hemoglobin up to 13.2 and hematocrit of 42.9%. Iron study, ferritin and vitamin B12 level are still pending. I recommended for the patient to continue on the oral iron tablet and vitamin B12 injection as planned. I will see her back for follow-up visit in 3 months for evaluation and repeat blood work unless her iron study showed significant deficiency, I will arrange for her to receive additional iron infusion in the interval. The patient was advised to call immediately if she has any other concerning symptoms in the interval. The patient voices understanding of current disease status and treatment options and is in agreement with the current care plan.  All questions were answered. The patient knows to call the clinic with any problems, questions or concerns. We can certainly see the patient much sooner if necessary.  The total time spent in the appointment was 20 minutes.  Disclaimer: This note was dictated with voice recognition software. Similar sounding words can inadvertently be transcribed and may not be corrected upon review.

## 2022-04-07 ENCOUNTER — Other Ambulatory Visit: Payer: BC Managed Care – PPO

## 2022-04-07 ENCOUNTER — Ambulatory Visit: Payer: BC Managed Care – PPO

## 2022-04-07 ENCOUNTER — Ambulatory Visit: Payer: BC Managed Care – PPO | Admitting: Internal Medicine

## 2022-05-11 ENCOUNTER — Inpatient Hospital Stay: Payer: BC Managed Care – PPO | Attending: Physician Assistant

## 2022-05-11 DIAGNOSIS — N920 Excessive and frequent menstruation with regular cycle: Secondary | ICD-10-CM | POA: Insufficient documentation

## 2022-05-11 DIAGNOSIS — D500 Iron deficiency anemia secondary to blood loss (chronic): Secondary | ICD-10-CM | POA: Insufficient documentation

## 2022-05-11 DIAGNOSIS — E538 Deficiency of other specified B group vitamins: Secondary | ICD-10-CM | POA: Diagnosis present

## 2022-05-11 DIAGNOSIS — Z79899 Other long term (current) drug therapy: Secondary | ICD-10-CM | POA: Diagnosis not present

## 2022-05-11 DIAGNOSIS — D509 Iron deficiency anemia, unspecified: Secondary | ICD-10-CM

## 2022-05-11 MED ORDER — CYANOCOBALAMIN 1000 MCG/ML IJ SOLN
1000.0000 ug | Freq: Once | INTRAMUSCULAR | Status: AC
  Administered 2022-05-11: 1000 ug via INTRAMUSCULAR
  Filled 2022-05-11: qty 1

## 2022-05-11 NOTE — Patient Instructions (Signed)
Vitamin B12 Deficiency Vitamin B12 deficiency occurs when the body does not have enough of this important vitamin. The body needs this vitamin: To make red blood cells. To make DNA. This is the genetic material inside cells. To help the nerves work properly so they can carry messages from the brain to the body. Vitamin B12 deficiency can cause health problems, such as not having enough red blood cells in the blood (anemia). This can lead to nerve damage if untreated. What are the causes? This condition may be caused by: Not eating enough foods that contain vitamin B12. Not having enough stomach acid and digestive fluids to properly absorb vitamin B12 from the food that you eat. Having certain diseases that make it hard to absorb vitamin B12. These diseases include Crohn's disease, chronic pancreatitis, and cystic fibrosis. An autoimmune disorder in which the body does not make enough of a protein (intrinsic factor) within the stomach, resulting in not enough absorption of vitamin B12. Having a surgery in which part of the stomach or small intestine is removed. Taking certain medicines that make it hard for the body to absorb vitamin B12. These include: Heartburn medicines, such as antacids and proton pump inhibitors. Some medicines that are used to treat diabetes. What increases the risk? The following factors may make you more likely to develop a vitamin B12 deficiency: Being an older adult. Eating a vegetarian or vegan diet that does not include any foods that come from animals. Eating a poor diet while you are pregnant. Taking certain medicines. Having alcoholism. What are the signs or symptoms? In some cases, there are no symptoms of this condition. If the condition leads to anemia or nerve damage, various symptoms may occur, such as: Weakness. Tiredness (fatigue). Loss of appetite. Numbness or tingling in your hands and feet. Redness and burning of the tongue. Depression,  confusion, or memory problems. Trouble walking. If anemia is severe, symptoms can include: Shortness of breath. Dizziness. Rapid heart rate. How is this diagnosed? This condition may be diagnosed with a blood test to measure the level of vitamin B12 in your blood. You may also have other tests, including: A group of tests that measure certain characteristics of blood cells (complete blood count, CBC). A blood test to measure intrinsic factor. A procedure where a thin tube with a camera on the end is used to look into your stomach or intestines (endoscopy). Other tests may be needed to discover the cause of the deficiency. How is this treated? Treatment for this condition depends on the cause. This condition may be treated by: Changing your eating and drinking habits, such as: Eating more foods that contain vitamin B12. Drinking less alcohol or no alcohol. Getting vitamin B12 injections. Taking vitamin B12 supplements by mouth (orally). Your health care provider will tell you which dose is best for you. Follow these instructions at home: Eating and drinking  Include foods in your diet that come from animals and contain a lot of vitamin B12. These include: Meats and poultry. This includes beef, pork, chicken, turkey, and organ meats, such as liver. Seafood. This includes clams, rainbow trout, salmon, tuna, and haddock. Eggs. Dairy foods such as milk, yogurt, and cheese. Eat foods that have vitamin B12 added to them (are fortified), such as ready-to-eat breakfast cereals. Check the label on the package to see if a food is fortified. The items listed above may not be a complete list of foods and beverages you can eat and drink. Contact a dietitian for   more information. Alcohol use Do not drink alcohol if: Your health care provider tells you not to drink. You are pregnant, may be pregnant, or are planning to become pregnant. If you drink alcohol: Limit how much you have to: 0-1 drink a  day for women. 0-2 drinks a day for men. Know how much alcohol is in your drink. In the [place] one drink equals one 12 oz bottle of beer (355 mL), one 5 oz glass of wine (148 mL), or one 1 oz glass of hard liquor (44 mL). General instructions Get vitamin B12 injections if told to by your health care provider. Take supplements only as told by your health care provider. Follow the directions carefully. Keep all follow-up visits. This is important. Contact a health care provider if: Your symptoms come back. Your symptoms get worse or do not improve with treatment. Get help right away: You develop shortness of breath. You have a rapid heart rate. You have chest pain. You become dizzy or you faint. These symptoms may be an emergency. Get help right away. Call 911. Do not wait to see if the symptoms will go away. Do not drive yourself to the hospital. Summary Vitamin B12 deficiency occurs when the body does not have enough of this important vitamin. Common causes include not eating enough foods that contain vitamin B12, not being able to absorb vitamin B12 from the food that you eat, having a surgery in which part of the stomach or small intestine is removed, or taking certain medicines. Eat foods that have vitamin B12 in them. Treatment may include making a change in the way you eat and drink, getting vitamin B12 injections, or taking vitamin B12 supplements. This information is not intended to replace advice given to you by your health care provider. Make sure you discuss any questions you have with your health care provider. Document Revised: 02/12/2021 Document Reviewed: 02/12/2021 [employer] Patient Education  2023 [employer]  

## 2022-05-12 ENCOUNTER — Ambulatory Visit: Payer: BC Managed Care – PPO

## 2022-06-08 ENCOUNTER — Inpatient Hospital Stay: Payer: BC Managed Care – PPO

## 2022-06-09 ENCOUNTER — Ambulatory Visit: Payer: BC Managed Care – PPO

## 2022-07-07 ENCOUNTER — Inpatient Hospital Stay: Payer: BC Managed Care – PPO | Admitting: Internal Medicine

## 2022-07-07 ENCOUNTER — Ambulatory Visit: Payer: BC Managed Care – PPO | Admitting: Internal Medicine

## 2022-07-07 ENCOUNTER — Inpatient Hospital Stay: Payer: BC Managed Care – PPO | Attending: Physician Assistant

## 2022-07-07 ENCOUNTER — Other Ambulatory Visit: Payer: BC Managed Care – PPO
